# Patient Record
Sex: Male | Born: 1985 | Hispanic: Yes | Marital: Single | Smoking: Never smoker
Health system: Southern US, Community
[De-identification: ages and names within clinical notes are randomized; demographics above are authoritative.]

## PROBLEM LIST (undated history)

## (undated) DIAGNOSIS — T4145XA Adverse effect of unspecified anesthetic, initial encounter: Secondary | ICD-10-CM

## (undated) DIAGNOSIS — T8859XA Other complications of anesthesia, initial encounter: Secondary | ICD-10-CM

## (undated) DIAGNOSIS — Z789 Other specified health status: Secondary | ICD-10-CM

---
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
## 2018-10-04 ENCOUNTER — Encounter (HOSPITAL_COMMUNITY): Payer: Self-pay

## 2018-10-04 ENCOUNTER — Encounter (HOSPITAL_COMMUNITY)
Admission: EM | Disposition: A | Discharge status: Home / Self Care | Payer: Self-pay | Source: Home / Self Care | Attending: Emergency Medicine

## 2018-10-04 ENCOUNTER — Observation Stay (HOSPITAL_COMMUNITY): Payer: Worker's Compensation

## 2018-10-04 ENCOUNTER — Ambulatory Visit: Admit: 2018-10-04 | Payer: Self-pay | Admitting: Student

## 2018-10-04 ENCOUNTER — Emergency Department (HOSPITAL_COMMUNITY): Payer: Worker's Compensation | Admitting: Certified Registered Nurse Anesthetist

## 2018-10-04 ENCOUNTER — Emergency Department (HOSPITAL_COMMUNITY): Payer: Worker's Compensation

## 2018-10-04 ENCOUNTER — Other Ambulatory Visit: Payer: Self-pay

## 2018-10-04 ENCOUNTER — Observation Stay (HOSPITAL_COMMUNITY)
Admission: EM | Admit: 2018-10-04 | Discharge: 2018-10-06 | Disposition: A | Discharge status: Home / Self Care | Payer: Worker's Compensation | Attending: Student | Admitting: Student

## 2018-10-04 DIAGNOSIS — T148XXA Other injury of unspecified body region, initial encounter: Secondary | ICD-10-CM

## 2018-10-04 DIAGNOSIS — S82261A Displaced segmental fracture of shaft of right tibia, initial encounter for closed fracture: Principal | ICD-10-CM | POA: Insufficient documentation

## 2018-10-04 DIAGNOSIS — Y990 Civilian activity done for income or pay: Secondary | ICD-10-CM | POA: Insufficient documentation

## 2018-10-04 DIAGNOSIS — Z79899 Other long term (current) drug therapy: Secondary | ICD-10-CM | POA: Diagnosis not present

## 2018-10-04 DIAGNOSIS — Z7982 Long term (current) use of aspirin: Secondary | ICD-10-CM | POA: Insufficient documentation

## 2018-10-04 DIAGNOSIS — R2689 Other abnormalities of gait and mobility: Secondary | ICD-10-CM | POA: Insufficient documentation

## 2018-10-04 DIAGNOSIS — S82401A Unspecified fracture of shaft of right fibula, initial encounter for closed fracture: Secondary | ICD-10-CM

## 2018-10-04 DIAGNOSIS — S82201A Unspecified fracture of shaft of right tibia, initial encounter for closed fracture: Secondary | ICD-10-CM | POA: Diagnosis present

## 2018-10-04 DIAGNOSIS — Y929 Unspecified place or not applicable: Secondary | ICD-10-CM | POA: Insufficient documentation

## 2018-10-04 DIAGNOSIS — M79606 Pain in leg, unspecified: Secondary | ICD-10-CM | POA: Diagnosis present

## 2018-10-04 DIAGNOSIS — W230XXA Caught, crushed, jammed, or pinched between moving objects, initial encounter: Secondary | ICD-10-CM | POA: Insufficient documentation

## 2018-10-04 DIAGNOSIS — Z419 Encounter for procedure for purposes other than remedying health state, unspecified: Secondary | ICD-10-CM

## 2018-10-04 HISTORY — PX: TIBIA IM NAIL INSERTION: SHX2516

## 2018-10-04 LAB — CBC WITH DIFFERENTIAL/PLATELET
Abs Immature Granulocytes: 0.1 10*3/uL — ABNORMAL HIGH (ref 0.00–0.07)
BASOS ABS: 0.1 10*3/uL (ref 0.0–0.1)
BASOS PCT: 0 %
Eosinophils Absolute: 0.1 10*3/uL (ref 0.0–0.5)
Eosinophils Relative: 1 %
HCT: 46.5 % (ref 39.0–52.0)
Hemoglobin: 14.2 g/dL (ref 13.0–17.0)
Immature Granulocytes: 1 %
Lymphocytes Relative: 21 %
Lymphs Abs: 3.5 10*3/uL (ref 0.7–4.0)
MCH: 26.7 pg (ref 26.0–34.0)
MCHC: 30.5 g/dL (ref 30.0–36.0)
MCV: 87.4 fL (ref 80.0–100.0)
MONO ABS: 1.3 10*3/uL — AB (ref 0.1–1.0)
Monocytes Relative: 8 %
NRBC: 0 % (ref 0.0–0.2)
Neutro Abs: 11.3 10*3/uL — ABNORMAL HIGH (ref 1.7–7.7)
Neutrophils Relative %: 69 %
PLATELETS: 355 10*3/uL (ref 150–400)
RBC: 5.32 MIL/uL (ref 4.22–5.81)
RDW: 11.9 % (ref 11.5–15.5)
WBC: 16.3 10*3/uL — AB (ref 4.0–10.5)

## 2018-10-04 LAB — BASIC METABOLIC PANEL
Anion gap: 10 (ref 5–15)
BUN: 9 mg/dL (ref 6–20)
CHLORIDE: 109 mmol/L (ref 98–111)
CO2: 20 mmol/L — AB (ref 22–32)
CREATININE: 0.87 mg/dL (ref 0.61–1.24)
Calcium: 8.8 mg/dL — ABNORMAL LOW (ref 8.9–10.3)
GFR calc Af Amer: >60 mL/min (ref >60)
Glucose, Bld: 131 mg/dL — ABNORMAL HIGH (ref 70–99)
POTASSIUM: 3.6 mmol/L (ref 3.5–5.1)
Sodium: 139 mmol/L (ref 135–145)

## 2018-10-04 LAB — CBG MONITORING, ED: Glucose-Capillary: 110 mg/dL — ABNORMAL HIGH (ref 70–99)

## 2018-10-04 SURGERY — INSERTION, INTRAMEDULLARY ROD, TIBIA
Anesthesia: General | Site: Leg Lower | Laterality: Right

## 2018-10-04 MED ORDER — KETAMINE HCL 100 MG/ML IJ SOLN
INTRAMUSCULAR | Status: AC
  Filled 2018-10-04: qty 1

## 2018-10-04 MED ORDER — KETOROLAC TROMETHAMINE 15 MG/ML IJ SOLN
15.0000 mg | Freq: Four times a day (QID) | INTRAMUSCULAR | Status: AC
  Administered 2018-10-04 – 2018-10-05 (×5): 15 mg via INTRAVENOUS
  Filled 2018-10-04 (×5): qty 1

## 2018-10-04 MED ORDER — LACTATED RINGERS IV SOLN: INTRAVENOUS | Status: DC

## 2018-10-04 MED ORDER — ONDANSETRON HCL 4 MG/2ML IJ SOLN
4.0000 mg | Freq: Once | INTRAMUSCULAR | Status: AC
  Administered 2018-10-04: 4 mg via INTRAVENOUS
  Filled 2018-10-04: qty 2

## 2018-10-04 MED ORDER — SUGAMMADEX SODIUM 200 MG/2ML IV SOLN
INTRAVENOUS | Status: DC | PRN
  Administered 2018-10-04: 200 mg via INTRAVENOUS

## 2018-10-04 MED ORDER — LIDOCAINE 2% (20 MG/ML) 5 ML SYRINGE
INTRAMUSCULAR | Status: DC | PRN
  Administered 2018-10-04: 100 mg via INTRAVENOUS

## 2018-10-04 MED ORDER — MIDAZOLAM HCL 2 MG/2ML IJ SOLN
INTRAMUSCULAR | Status: DC | PRN
  Administered 2018-10-04: 2 mg via INTRAVENOUS

## 2018-10-04 MED ORDER — VANCOMYCIN HCL 1000 MG IV SOLR
INTRAVENOUS | Status: AC
  Filled 2018-10-04: qty 1000

## 2018-10-04 MED ORDER — FENTANYL CITRATE (PF) 100 MCG/2ML IJ SOLN
INTRAMUSCULAR | Status: AC
  Filled 2018-10-04: qty 2

## 2018-10-04 MED ORDER — FENTANYL CITRATE (PF) 250 MCG/5ML IJ SOLN
INTRAMUSCULAR | Status: AC
  Filled 2018-10-04: qty 5

## 2018-10-04 MED ORDER — HYDROMORPHONE HCL 1 MG/ML IJ SOLN
1.0000 mg | INTRAMUSCULAR | Status: DC | PRN
  Administered 2018-10-04 – 2018-10-06 (×2): 1 mg via INTRAVENOUS
  Filled 2018-10-04 (×2): qty 1

## 2018-10-04 MED ORDER — ONDANSETRON HCL 4 MG/2ML IJ SOLN
INTRAMUSCULAR | Status: AC
  Filled 2018-10-04: qty 2

## 2018-10-04 MED ORDER — ONDANSETRON HCL 4 MG PO TABS: 4.0000 mg | ORAL_TABLET | Freq: Four times a day (QID) | ORAL | Status: DC | PRN

## 2018-10-04 MED ORDER — DOCUSATE SODIUM 100 MG PO CAPS
100.0000 mg | ORAL_CAPSULE | Freq: Two times a day (BID) | ORAL | Status: DC
  Administered 2018-10-04 – 2018-10-06 (×4): 100 mg via ORAL
  Filled 2018-10-04 (×4): qty 1

## 2018-10-04 MED ORDER — KETAMINE HCL 10 MG/ML IJ SOLN
INTRAMUSCULAR | Status: DC | PRN
  Administered 2018-10-04: 50 mg via INTRAVENOUS

## 2018-10-04 MED ORDER — HYDROMORPHONE HCL 1 MG/ML IJ SOLN
1.0000 mg | Freq: Once | INTRAMUSCULAR | Status: AC
  Administered 2018-10-04: 1 mg via INTRAVENOUS
  Filled 2018-10-04: qty 1

## 2018-10-04 MED ORDER — METHOCARBAMOL 1000 MG/10ML IJ SOLN
500.0000 mg | Freq: Four times a day (QID) | INTRAVENOUS | Status: DC | PRN
  Filled 2018-10-04: qty 5

## 2018-10-04 MED ORDER — FENTANYL CITRATE (PF) 100 MCG/2ML IJ SOLN
25.0000 ug | INTRAMUSCULAR | Status: DC | PRN
  Administered 2018-10-04 (×3): 50 ug via INTRAVENOUS

## 2018-10-04 MED ORDER — DEXAMETHASONE SODIUM PHOSPHATE 10 MG/ML IJ SOLN
INTRAMUSCULAR | Status: AC
  Filled 2018-10-04: qty 1

## 2018-10-04 MED ORDER — 0.9 % SODIUM CHLORIDE (POUR BTL) OPTIME
TOPICAL | Status: DC | PRN
  Administered 2018-10-04: 1000 mL

## 2018-10-04 MED ORDER — DIPHENHYDRAMINE HCL 12.5 MG/5ML PO ELIX: 12.5000 mg | ORAL_SOLUTION | ORAL | Status: DC | PRN

## 2018-10-04 MED ORDER — METHOCARBAMOL 500 MG PO TABS
500.0000 mg | ORAL_TABLET | Freq: Four times a day (QID) | ORAL | Status: DC | PRN
  Administered 2018-10-05 – 2018-10-06 (×3): 500 mg via ORAL
  Filled 2018-10-04 (×3): qty 1

## 2018-10-04 MED ORDER — MEPERIDINE HCL 50 MG/ML IJ SOLN: 6.2500 mg | INTRAMUSCULAR | Status: DC | PRN

## 2018-10-04 MED ORDER — VANCOMYCIN HCL 1000 MG IV SOLR
INTRAVENOUS | Status: DC | PRN
  Administered 2018-10-04: 1000 mg via TOPICAL

## 2018-10-04 MED ORDER — CHLORHEXIDINE GLUCONATE 4 % EX LIQD: 60.0000 mL | Freq: Once | CUTANEOUS | Status: DC

## 2018-10-04 MED ORDER — SUCCINYLCHOLINE CHLORIDE 200 MG/10ML IV SOSY
PREFILLED_SYRINGE | INTRAVENOUS | Status: AC
  Filled 2018-10-04: qty 10

## 2018-10-04 MED ORDER — PROPOFOL 10 MG/ML IV BOLUS
INTRAVENOUS | Status: DC | PRN
  Administered 2018-10-04: 200 mg via INTRAVENOUS

## 2018-10-04 MED ORDER — METOCLOPRAMIDE HCL 5 MG/ML IJ SOLN: 10.0000 mg | Freq: Once | INTRAMUSCULAR | Status: DC | PRN

## 2018-10-04 MED ORDER — GABAPENTIN 100 MG PO CAPS
100.0000 mg | ORAL_CAPSULE | Freq: Three times a day (TID) | ORAL | Status: DC
  Administered 2018-10-04 – 2018-10-06 (×5): 100 mg via ORAL
  Filled 2018-10-04 (×5): qty 1

## 2018-10-04 MED ORDER — MIDAZOLAM HCL 2 MG/2ML IJ SOLN
INTRAMUSCULAR | Status: AC
  Filled 2018-10-04: qty 2

## 2018-10-04 MED ORDER — ONDANSETRON HCL 4 MG/2ML IJ SOLN: 4.0000 mg | Freq: Four times a day (QID) | INTRAMUSCULAR | Status: DC | PRN

## 2018-10-04 MED ORDER — SUCCINYLCHOLINE CHLORIDE 20 MG/ML IJ SOLN
INTRAMUSCULAR | Status: DC | PRN
  Administered 2018-10-04: 120 mg via INTRAVENOUS

## 2018-10-04 MED ORDER — POLYETHYLENE GLYCOL 3350 17 G PO PACK: 17.0000 g | PACK | Freq: Every day | ORAL | Status: DC | PRN

## 2018-10-04 MED ORDER — OXYCODONE-ACETAMINOPHEN 5-325 MG PO TABS: 1.0000 | ORAL_TABLET | ORAL | Status: DC | PRN

## 2018-10-04 MED ORDER — CEFAZOLIN SODIUM-DEXTROSE 2-4 GM/100ML-% IV SOLN
2.0000 g | Freq: Three times a day (TID) | INTRAVENOUS | Status: AC
  Administered 2018-10-04 – 2018-10-05 (×3): 2 g via INTRAVENOUS
  Filled 2018-10-04 (×3): qty 100

## 2018-10-04 MED ORDER — ONDANSETRON HCL 4 MG/2ML IJ SOLN
INTRAMUSCULAR | Status: DC | PRN
  Administered 2018-10-04: 4 mg via INTRAVENOUS

## 2018-10-04 MED ORDER — LACTATED RINGERS IV SOLN
INTRAVENOUS | Status: DC
  Administered 2018-10-04 (×2): via INTRAVENOUS

## 2018-10-04 MED ORDER — POVIDONE-IODINE 10 % EX SWAB: 2.0000 "application " | Freq: Once | CUTANEOUS | Status: DC

## 2018-10-04 MED ORDER — ASPIRIN 325 MG PO TABS
325.0000 mg | ORAL_TABLET | Freq: Every day | ORAL | Status: DC
  Administered 2018-10-04 – 2018-10-06 (×3): 325 mg via ORAL
  Filled 2018-10-04 (×3): qty 1

## 2018-10-04 MED ORDER — DEXAMETHASONE SODIUM PHOSPHATE 10 MG/ML IJ SOLN
INTRAMUSCULAR | Status: DC | PRN
  Administered 2018-10-04: 5 mg via INTRAVENOUS

## 2018-10-04 MED ORDER — BACITRACIN ZINC 500 UNIT/GM EX OINT
TOPICAL_OINTMENT | CUTANEOUS | Status: AC
  Filled 2018-10-04: qty 28.35

## 2018-10-04 MED ORDER — DEXMEDETOMIDINE HCL IN NACL 200 MCG/50ML IV SOLN
INTRAVENOUS | Status: DC | PRN
  Administered 2018-10-04: 8 ug via INTRAVENOUS
  Administered 2018-10-04 (×2): 20 ug via INTRAVENOUS

## 2018-10-04 MED ORDER — CEFAZOLIN SODIUM-DEXTROSE 2-4 GM/100ML-% IV SOLN
INTRAVENOUS | Status: AC
  Filled 2018-10-04: qty 100

## 2018-10-04 MED ORDER — KETAMINE HCL 50 MG/5ML IJ SOSY
PREFILLED_SYRINGE | INTRAMUSCULAR | Status: AC
  Filled 2018-10-04: qty 5

## 2018-10-04 MED ORDER — ROCURONIUM BROMIDE 10 MG/ML (PF) SYRINGE
PREFILLED_SYRINGE | INTRAVENOUS | Status: DC | PRN
  Administered 2018-10-04: 30 mg via INTRAVENOUS
  Administered 2018-10-04: 50 mg via INTRAVENOUS

## 2018-10-04 MED ORDER — CEFAZOLIN SODIUM-DEXTROSE 2-4 GM/100ML-% IV SOLN
2.0000 g | INTRAVENOUS | Status: AC
  Administered 2018-10-04: 2 g via INTRAVENOUS

## 2018-10-04 MED ORDER — OXYCODONE-ACETAMINOPHEN 5-325 MG PO TABS
2.0000 | ORAL_TABLET | Freq: Four times a day (QID) | ORAL | Status: DC | PRN
  Administered 2018-10-04 – 2018-10-06 (×5): 2 via ORAL
  Filled 2018-10-04 (×6): qty 2

## 2018-10-04 MED ORDER — SODIUM CHLORIDE 0.9 % IV SOLN
INTRAVENOUS | Status: DC | PRN
  Administered 2018-10-04: 250 mL via INTRAVENOUS

## 2018-10-04 MED ORDER — BACITRACIN 500 UNIT/GM EX OINT
TOPICAL_OINTMENT | CUTANEOUS | Status: DC | PRN
  Administered 2018-10-04: 1 via TOPICAL

## 2018-10-04 MED ORDER — FENTANYL CITRATE (PF) 250 MCG/5ML IJ SOLN
INTRAMUSCULAR | Status: DC | PRN
  Administered 2018-10-04: 25 ug via INTRAVENOUS
  Administered 2018-10-04: 100 ug via INTRAVENOUS
  Administered 2018-10-04: 25 ug via INTRAVENOUS
  Administered 2018-10-04: 100 ug via INTRAVENOUS
  Administered 2018-10-04: 50 ug via INTRAVENOUS
  Administered 2018-10-04 (×4): 25 ug via INTRAVENOUS
  Administered 2018-10-04: 150 ug via INTRAVENOUS

## 2018-10-04 MED ORDER — ACETAMINOPHEN 500 MG PO TABS
500.0000 mg | ORAL_TABLET | Freq: Two times a day (BID) | ORAL | Status: DC
  Administered 2018-10-04: 500 mg via ORAL
  Filled 2018-10-04 (×3): qty 1

## 2018-10-04 SURGICAL SUPPLY — 83 items
BANDAGE ACE 4X5 VEL STRL LF (GAUZE/BANDAGES/DRESSINGS) ×4 IMPLANT
BANDAGE ACE 6X5 VEL STRL LF (GAUZE/BANDAGES/DRESSINGS) ×4 IMPLANT
BANDAGE ELASTIC 4 VELCRO ST LF (GAUZE/BANDAGES/DRESSINGS) ×4 IMPLANT
BANDAGE ELASTIC 6 VELCRO ST LF (GAUZE/BANDAGES/DRESSINGS) ×4 IMPLANT
BIT DRILL CALIBRATED 4.2 (BIT) ×2 IMPLANT
BIT DRILL SHORT 4.2 (BIT) ×4 IMPLANT
BLADE SURG 10 STRL SS (BLADE) ×8 IMPLANT
BNDG COHESIVE 4X5 TAN STRL (GAUZE/BANDAGES/DRESSINGS) ×4 IMPLANT
BNDG GAUZE ELAST 4 BULKY (GAUZE/BANDAGES/DRESSINGS) ×8 IMPLANT
BRUSH SCRUB SURG 4.25 DISP (MISCELLANEOUS) ×8 IMPLANT
CANISTER WOUND CARE 500ML ATS (WOUND CARE) IMPLANT
CHLORAPREP W/TINT 26ML (MISCELLANEOUS) ×4 IMPLANT
CLOSURE WOUND 1/2 X4 (GAUZE/BANDAGES/DRESSINGS)
COVER SURGICAL LIGHT HANDLE (MISCELLANEOUS) ×8 IMPLANT
COVER WAND RF STERILE (DRAPES) ×4 IMPLANT
CUFF TOURNIQUET SINGLE 24IN (TOURNIQUET CUFF) IMPLANT
CUFF TOURNIQUET SINGLE 34IN LL (TOURNIQUET CUFF) IMPLANT
DRAPE C-ARM 42X72 X-RAY (DRAPES) ×4 IMPLANT
DRAPE C-ARMOR (DRAPES) ×4 IMPLANT
DRAPE HALF SHEET 40X57 (DRAPES) ×8 IMPLANT
DRAPE IMP U-DRAPE 54X76 (DRAPES) ×8 IMPLANT
DRAPE INCISE IOBAN 66X45 STRL (DRAPES) ×4 IMPLANT
DRAPE ORTHO SPLIT 77X108 STRL (DRAPES) ×4
DRAPE SURG ORHT 6 SPLT 77X108 (DRAPES) ×4 IMPLANT
DRAPE U-SHAPE 47X51 STRL (DRAPES) ×4 IMPLANT
DRILL BIT CALIBRATED 4.2 (BIT) ×4
DRILL BIT SHORT 4.2 (BIT) ×4
DRSG ADAPTIC 3X8 NADH LF (GAUZE/BANDAGES/DRESSINGS) ×4 IMPLANT
DRSG MEPITEL 4X7.2 (GAUZE/BANDAGES/DRESSINGS) IMPLANT
DRSG PAD ABDOMINAL 8X10 ST (GAUZE/BANDAGES/DRESSINGS) ×4 IMPLANT
DRSG VAC ATS LRG SENSATRAC (GAUZE/BANDAGES/DRESSINGS) IMPLANT
DRSG VAC ATS MED SENSATRAC (GAUZE/BANDAGES/DRESSINGS) IMPLANT
DRSG VAC ATS SM SENSATRAC (GAUZE/BANDAGES/DRESSINGS) IMPLANT
ELECT REM PT RETURN 9FT ADLT (ELECTROSURGICAL) ×4
ELECTRODE REM PT RTRN 9FT ADLT (ELECTROSURGICAL) ×2 IMPLANT
GAUZE SPONGE 4X4 12PLY STRL (GAUZE/BANDAGES/DRESSINGS) ×4 IMPLANT
GAUZE SPONGE 4X4 12PLY STRL LF (GAUZE/BANDAGES/DRESSINGS) ×4 IMPLANT
GLOVE BIO SURGEON STRL SZ7.5 (GLOVE) ×16 IMPLANT
GLOVE BIOGEL PI IND STRL 7.5 (GLOVE) ×2 IMPLANT
GLOVE BIOGEL PI INDICATOR 7.5 (GLOVE) ×2
GOWN STRL REUS W/ TWL LRG LVL3 (GOWN DISPOSABLE) ×4 IMPLANT
GOWN STRL REUS W/TWL LRG LVL3 (GOWN DISPOSABLE) ×4
GUIDEWIRE 3.2X400 (WIRE) ×4 IMPLANT
KIT BASIN OR (CUSTOM PROCEDURE TRAY) ×4 IMPLANT
KIT TURNOVER KIT B (KITS) ×4 IMPLANT
MANIFOLD NEPTUNE II (INSTRUMENTS) ×4 IMPLANT
NAIL TIBIAL EX W/BEND 10X345M (Nail) ×4 IMPLANT
NEEDLE 22X1 1/2 (OR ONLY) (NEEDLE) IMPLANT
NS IRRIG 1000ML POUR BTL (IV SOLUTION) ×4 IMPLANT
PACK GENERAL/GYN (CUSTOM PROCEDURE TRAY) ×4 IMPLANT
PACK ORTHO EXTREMITY (CUSTOM PROCEDURE TRAY) ×4 IMPLANT
PACK TOTAL JOINT (CUSTOM PROCEDURE TRAY) ×4 IMPLANT
PAD ARMBOARD 7.5X6 YLW CONV (MISCELLANEOUS) ×8 IMPLANT
PAD CAST 4YDX4 CTTN HI CHSV (CAST SUPPLIES) ×2 IMPLANT
PADDING CAST COTTON 4X4 STRL (CAST SUPPLIES) ×2
PADDING CAST COTTON 6X4 STRL (CAST SUPPLIES) ×4 IMPLANT
REAMER ROD DEEP FLUTE 2.5X950 (INSTRUMENTS) ×4 IMPLANT
SCREW LOCK STAR 5X34 (Screw) ×8 IMPLANT
SCREW LOCK STAR 5X70 (Screw) ×4 IMPLANT
SCREW LOCK STAR 5X72 (Screw) ×4 IMPLANT
SCREW LOCKING 5.0X34MM (Screw) ×4 IMPLANT
SET MONITOR QUICK PRESSURE (MISCELLANEOUS) IMPLANT
SPLINT PLASTER CAST XFAST 5X30 (CAST SUPPLIES) ×2 IMPLANT
SPLINT PLASTER XFAST SET 5X30 (CAST SUPPLIES) ×2
SPONGE LAP 18X18 X RAY DECT (DISPOSABLE) ×4 IMPLANT
STAPLER VISISTAT 35W (STAPLE) ×4 IMPLANT
STOCKINETTE IMPERVIOUS 9X36 MD (GAUZE/BANDAGES/DRESSINGS) ×4 IMPLANT
STRIP CLOSURE SKIN 1/2X4 (GAUZE/BANDAGES/DRESSINGS) IMPLANT
SUT ETHILON 2 0 FSLX (SUTURE) IMPLANT
SUT MNCRL AB 3-0 PS2 18 (SUTURE) ×4 IMPLANT
SUT PROLENE 0 CT (SUTURE) IMPLANT
SUT VIC AB 0 CT1 27 (SUTURE)
SUT VIC AB 0 CT1 27XBRD ANBCTR (SUTURE) IMPLANT
SUT VIC AB 0 CTB1 27 (SUTURE) IMPLANT
SUT VIC AB 2-0 CT1 27 (SUTURE)
SUT VIC AB 2-0 CT1 TAPERPNT 27 (SUTURE) IMPLANT
SUT VIC AB 2-0 CT3 27 (SUTURE) IMPLANT
SUT VIC AB 2-0 CTB1 (SUTURE) IMPLANT
TOWEL OR 17X24 6PK STRL BLUE (TOWEL DISPOSABLE) ×8 IMPLANT
TOWEL OR 17X26 10 PK STRL BLUE (TOWEL DISPOSABLE) ×8 IMPLANT
UNDERPAD 30X30 (UNDERPADS AND DIAPERS) ×4 IMPLANT
WATER STERILE IRR 1000ML POUR (IV SOLUTION) ×4 IMPLANT
YANKAUER SUCT BULB TIP NO VENT (SUCTIONS) IMPLANT

## 2018-10-04 NOTE — Anesthesia Procedure Notes (Signed)
Procedure Name: Intubation Date/Time: 10/04/2018 12:49 PM Performed by: White, Amedeo Plenty, CRNA Pre-anesthesia Checklist: Patient identified, Emergency Drugs available, Suction available and Patient being monitored Patient Re-evaluated:Patient Re-evaluated prior to induction Oxygen Delivery Method: Circle System Utilized Preoxygenation: Pre-oxygenation with 100% oxygen Induction Type: IV induction, Rapid sequence and Cricoid Pressure applied Laryngoscope Size: Mac and 4 Grade View: Grade I Tube type: Oral Tube size: 7.5 mm Number of attempts: 1 Airway Equipment and Method: Stylet and Oral airway Placement Confirmation: ETT inserted through vocal cords under direct vision,  positive ETCO2 and breath sounds checked- equal and bilateral Secured at: 21 cm Tube secured with: Tape Dental Injury: Teeth and Oropharynx as per pre-operative assessment

## 2018-10-04 NOTE — ED Provider Notes (Signed)
[facility] EMERGENCY DEPARTMENT Provider Note   CSN: [id number] Arrival date & time: 10/04/18  1004     History   Chief Complaint Chief Complaint  Patient presents with  . Leg Injury    HPI Noach Calvillo is a 32 y.o. male.  The history is provided by the patient and the EMS personnel. No language interpreter was used.   Devontre Siedschlag is a 32 y.o. male who presents to the Emergency Department complaining of leg injury. He presents to the emergency department by EMS for evaluation of right leg injury. 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 There was a chain attached to a metal bar. The metal became disengaged from the chain and swung to hit him in the right to bed. He experienced immediate pain and it knocked him down. He did not knock off the platform. He denies any head injury or loss of consciousness. He received 250 g of fentanyl by EMS prior to ED arrival. He has no medical problems and takes no medications. History reviewed. No pertinent past medical history.  There are no active problems to display for this patient.   History reviewed. No pertinent surgical history.      Home Medications    Prior to Admission medications   Not on File    Family History No family history on file.  Social History Social History   Tobacco Use  . Smoking status: Never Smoker  . Smokeless tobacco: Never Used  Substance Use Topics  . Alcohol use: Never    Frequency: Never  . Drug use: Never     Allergies   Patient has no allergy information on record.   Review of Systems Review of Systems  All other systems reviewed and are negative.    Physical Exam Updated Vital Signs Ht 5\' 7"  (1.702 m)   Wt 104.3 kg   BMI 36.02 kg/m   Physical Exam  Constitutional: He is oriented to person, place, and time. He appears well-developed and well-nourished.  HENT:  Head: Normocephalic and atraumatic.  Cardiovascular:  Normal rate and regular rhythm.  No murmur heard. Pulmonary/Chest: Effort normal and breath sounds normal. No respiratory distress.  Abdominal: Soft. There is no tenderness. There is no rebound and no guarding.  Musculoskeletal:  2+ DP pulses bilaterally. There is a deformity with local tenderness and swelling to the right mid chin. There is an abrasion over the volar surface of the left fifth digit with flexion extension intact.  Neurological: He is alert and oriented to person, place, and time.  Skin: Skin is warm. He is diaphoretic.  Psychiatric: He has a normal mood and affect. His behavior is normal.  Nursing note and vitals reviewed.    ED Treatments / Results  Labs (all labs ordered are listed, but only abnormal results are displayed) Labs Reviewed  CBG MONITORING, ED    EKG None  Radiology No results found.  Procedures Procedures (including critical care time)  Medications Ordered in ED Medications - No data to display   Initial Impression / Assessment and Plan / ED Course  I have reviewed the triage vital signs and the nursing notes.  Pertinent labs & imaging results that were available during my care of the patient were reviewed by me and considered in my medical decision making (see chart for details).     Patient here for evaluation of right leg injury. He has a right shin deformity with good  distal pulses. No evidence of additional significant trauma. Discussed with orthopedics on-call who evaluated the patient in the emergency department. Plan to admit for further surgical treatment.  Final Clinical Impressions(s) / ED Diagnoses   Final diagnoses:  Leg pain    ED Discharge Orders    None       Tilden Fossa, MD 10/04/18 1624

## 2018-10-04 NOTE — Transfer of Care (Signed)
Immediate Anesthesia Transfer of Care Note  Patient: Christopher Garrett  Procedure(s) Performed: INTRAMEDULLARY (IM) NAIL TIBIAL (Right Leg Lower)  Patient Location: PACU  Anesthesia Type:General  Level of Consciousness: drowsy and responds to stimulation  Airway & Oxygen Therapy: Patient Spontanous Breathing and Patient connected to nasal cannula oxygen  Post-op Assessment: Report given to RN and Post -op Vital signs reviewed and stable  Post vital signs: Reviewed and stable  Last Vitals:  Vitals Value Taken Time  BP 140/92 10/04/2018  4:21 PM  Temp    Pulse 114 10/04/2018  4:28 PM  Resp 14 10/04/2018  4:28 PM  SpO2 97 % 10/04/2018  4:28 PM  Vitals shown include unvalidated device data.  Last Pain:  Vitals:   10/04/18 1609  TempSrc:   PainSc: 7       Patients Stated Pain Goal: 4 (10/04/18 1609)  Complications: No apparent anesthesia complications

## 2018-10-04 NOTE — Op Note (Signed)
OrthopaedicSurgeryOperativeNote (ZOX:[id number]) Date of Surgery: 10/04/2018  Admit Date: 10/04/2018   Diagnoses: Pre-Op Diagnoses: Right segmental tibia fracture  Post-Op Diagnosis: Same  Procedures: CPT 27759-Intramedullary nailing of right tibial shaft fracture  Surgeons: Primary: Roby Lofts, MD   Location:[facility] OR ROOM 06   Anesthesia: General  Antibiotics:Ancef 2g preop   Tourniquettime:None  EstimatedBloodLoss:100 mL  Complications:None  Specimens:None  Implants: Implant Name Type Inv. Item Serial No. Manufacturer Lot No. LRB No. Used Action  NAIL TIBIAL EX W/BEND 10X345M - [id number] Nail NAIL TIBIAL EX W/BEND 10X345M  [employer] [id number] Right 1 Implanted  SCREW LOCKING 5.0X34MM - [id number] Screw SCREW LOCKING 5.0X34MM  [employer] [id number] Right 1 Implanted  SCREW LOCK STAR 5X34 - [id number] Screw SCREW LOCK STAR 5X34  [employer]  Right 1 Implanted  SCREW LOCK STAR 5X34 - [id number] Screw SCREW LOCK STAR 5X34  [employer]  Right 1 Implanted  SCREW LOCK STAR 5X72 - [id number] Screw SCREW LOCK STAR 5X72  [employer]  Right 1 Implanted  SCREW LOCK STAR 5X70 - [id number] Screw SCREW LOCK STAR 5X70  [employer]UMA  Right 1 Implanted    IndicationsforSurgery: 32 year old male who had a 600 pound beam fall on him and struck him in the right leg.  He immediate pain and deformity.  X-rays showed a segmental right tibial shaft fracture.  He had significant swelling in his compartments however they were soft and he has no pain with passive stretch.  However he has at high risk of developing compartment syndrome so I recommend proceeding urgently to perform intramedullary nailing with possible fasciotomy release depending on compartments.  Risks and benefits were discussed with the patient.Risks discussed included bleeding requiring blood transfusion, bleeding causing a hematoma, infection, malunion, nonunion, damage to surrounding nerves and blood  vessels, pain, hardware prominence or irritation, hardware failure, stiffness, post-traumatic arthritis, DVT/PE, compartment syndrome, and even death. Patient agrees to proceed with surgery and consent was obtained.  Operative Findings: Intramedullary nailing of right segmental tibial shaft fracture using Synthes EX nail 10x365mm with 3 proximal and two distal interlocking screws  Procedure: The patient was identified in the preoperative holding area. Consent was confirmed with the patient and all questions were answered. The operative extremity was marked after confirmation with the patient. They were then brought back to the operating room by our anesthesia colleagues.  The patient was placed under general anesthetic and then carefully transferred over to a radiolucent flat top table.  Here a bump was placed under the operative hip and a bone foam was placed under the operative extremity.  The operative extremity was then prepped and draped in usual sterile fashion. A preoperative timeout was performed to verify the patient, the procedure, and the extremity. Preoperative antibiotics were dosed.  Fluoroscopic imaging was obtained to show the displacement of the fracture.   I made a lateral parapatellar incision carried down through skin and subcutaneous tissue just lateral to the patellar tendon.  I released a portion of the lateral retinaculum but stayed extra-articular outside the capsule.  I excised the anterior fat pad and then proceeded to place a guidepin under fluoroscopic imaging to confirm adequate placement in both the AP and lateral views.  I then advanced a wire into the proximal metaphysis of the tibia.  I then used an entry reamer to enter the canal.  I passed a bent ball-tipped guidewire down the center of the canal and was able to pass in the distal segment after performing a closed reduction  maneuver.  I seated it down into the physeal scar.  I then measured the length of the nail and I  chose a 315 mm nail.  To prevent valgus and procurvatum deformity I placed two percutaneous incisions to place a pointed reduction tenaculum to hold the proximal reduction. I then proceeded to sequentially ream up from 8.5 mm to 11 mm.  I obtained excellent chatter and I chose to place an 10 mm nail.  I then placed nail across the fracture into the distal segment.  The fracture had excellent reduction after the nail was placed.  I seated the nail to where it was slightly buried at the lateral of the knee.  I then placed 2 distal interlocking screws from medial to lateral using perfect circle technique.  I then used my proximal jig to place 3 proximal interlocking screws through percutaneous incisions. I removed my clamp and the reduction was maintained. I removed the jig and obtained final fluoroscopic imaging.  The compartments were swollne but compressible and with preoperative exam, I did not feel the need to perform fasciotomy releases. The incisions were copiously irrigated.  I closed the lateral parapatellar incision with 0 Vicryl, 2-0 Vicryl and 3-0 nylon.  The remainder the incisions were closed with 3-0 nylon. A sterile dressing was placed and a well padded short leg splint was placed.. The patient was then awoken from anesthesia and taken to the PACU in stable condition.  Post Op Plan/Instructions: The patient will be touchdown weightbearing to the right lower extremity. Ancef postoperative prophylaxis. Aspirin 325 mg for DVT prophylaxis.  I was present and performed the entire surgery.  Truitt Merle, MD [facility]

## 2018-10-04 NOTE — Consult Note (Signed)
Reason for Consult:Right tib/fib fx Referring Physician: [name] is an 32 y.o. male.  HPI: Christopher Garrett was working when a 600# metal beam on a chain swung down and struck him in the right lower leg. He had immediate pain and could not get up and bear weight. He came to the ED for evaluation and x-rays showed a tib/fib fx and orthopedic surgery was consulted.  History reviewed. No pertinent past medical history.  History reviewed. No pertinent surgical history.  No family history on file.  Social History:  reports that he has never smoked. He has never used smokeless tobacco. He reports that he does not drink alcohol or use drugs.  Allergies: No Known Allergies  Medications: I have reviewed the patient's current medications.  Results for orders placed or performed during the hospital encounter of 10/04/18 (from the past 48 hour(s))  CBG monitoring, ED     Status: Abnormal   Collection Time: 10/04/18 10:11 AM  Result Value Ref Range   Glucose-Capillary 110 (H) 70 - 99 mg/dL  CBC with Differential     Status: Abnormal   Collection Time: 10/04/18 10:17 AM  Result Value Ref Range   WBC 16.3 (H) 4.0 - 10.5 K/uL   RBC 5.32 4.22 - 5.81 MIL/uL   Hemoglobin 14.2 13.0 - 17.0 g/dL   HCT 16.1 09.6 - 04.5 %   MCV 87.4 80.0 - 100.0 fL   MCH 26.7 26.0 - 34.0 pg   MCHC 30.5 30.0 - 36.0 g/dL   RDW 40.9 81.1 - 91.4 %   Platelets 355 150 - 400 K/uL   nRBC 0.0 0.0 - 0.2 %   Neutrophils Relative % 69 %   Neutro Abs 11.3 (H) 1.7 - 7.7 K/uL   Lymphocytes Relative 21 %   Lymphs Abs 3.5 0.7 - 4.0 K/uL   Monocytes Relative 8 %   Monocytes Absolute 1.3 (H) 0.1 - 1.0 K/uL   Eosinophils Relative 1 %   Eosinophils Absolute 0.1 0.0 - 0.5 K/uL   Basophils Relative 0 %   Basophils Absolute 0.1 0.0 - 0.1 K/uL   Immature Granulocytes 1 %   Abs Immature Granulocytes 0.10 (H) 0.00 - 0.07 K/uL    Comment: Performed at [facility], [address] [place], [place] [address]    No  results found.  Review of Systems  Constitutional: Negative for weight loss.  HENT: Negative for ear discharge, ear pain, hearing loss and tinnitus.   Eyes: Negative for blurred vision, double vision, photophobia and pain.  Respiratory: Negative for cough, sputum production and shortness of breath.   Cardiovascular: Negative for chest pain.  Gastrointestinal: Negative for abdominal pain, nausea and vomiting.  Genitourinary: Negative for dysuria, flank pain, frequency and urgency.  Musculoskeletal: Positive for joint pain (Right lower leg, left little finger). Negative for back pain, falls, myalgias and neck pain.  Neurological: Negative for dizziness, tingling, sensory change, focal weakness, loss of consciousness and headaches.  Endo/Heme/Allergies: Does not bruise/bleed easily.  Psychiatric/Behavioral: Negative for depression, memory loss and substance abuse. The patient is not nervous/anxious.    Blood pressure (!) 140/96, pulse 80, temperature 98.7 F (37.1 C), temperature source Oral, resp. rate 16, height 5\' 7"  (1.702 m), weight 104.3 kg, SpO2 97 %. Physical Exam  Constitutional: He appears well-developed and well-nourished. No distress.  HENT:  Head: Normocephalic and atraumatic.  Eyes: Conjunctivae are normal. Right eye exhibits no discharge. Left eye exhibits no discharge. No scleral icterus.  Neck: Normal range of motion.  Cardiovascular: Normal rate and regular rhythm.  Respiratory: Effort normal. No respiratory distress.  Musculoskeletal:  RLE No traumatic wounds, ecchymosis, or rash  Lower leg TTP, compartments soft  No knee or ankle effusion  Sens DPN, SPN, TN intact  Motor EHL, ext, flex, evers 5/5  DP 2+, PT 1+, No significant edema  Neurological: He is alert.  Skin: Skin is warm and dry. He is not diaphoretic.  Psychiatric: He has a normal mood and affect. His behavior is normal.    Assessment/Plan: Right tib/fib fx -- Plan for IMN vs ex fix +/- fasciotomies by  Dr. Jena Gauss today. Please keep NPO.    Christopher Caldron, PA-C Orthopedic Surgery [phone] 10/04/2018, 10:52 AM

## 2018-10-04 NOTE — Anesthesia Preprocedure Evaluation (Signed)
Anesthesia Evaluation  Patient identified by MRN, date of birth, ID band Patient awake    Reviewed: Allergy & Precautions, NPO status , Patient's Chart, lab work & pertinent test results  Airway Mallampati: II  TM Distance: >3 FB Neck ROM: Full    Dental no notable dental hx.    Pulmonary neg pulmonary ROS,    Pulmonary exam normal breath sounds clear to auscultation       Cardiovascular negative cardio ROS Normal cardiovascular exam Rhythm:Regular Rate:Normal     Neuro/Psych negative neurological ROS  negative psych ROS   GI/Hepatic negative GI ROS, Neg liver ROS,   Endo/Other  negative endocrine ROS  Renal/GU negative Renal ROS  negative genitourinary   Musculoskeletal negative musculoskeletal ROS (+)   Abdominal   Peds negative pediatric ROS (+)  Hematology negative hematology ROS (+)   Anesthesia Other Findings   Reproductive/Obstetrics negative OB ROS                             Anesthesia Physical Anesthesia Plan  ASA: II  Anesthesia Plan: General   Post-op Pain Management:    Induction: Intravenous, Rapid sequence and Cricoid pressure planned  PONV Risk Score and Plan: 2 and Ondansetron and Treatment may vary due to age or medical condition  Airway Management Planned: Oral ETT  Additional Equipment:   Intra-op Plan:   Post-operative Plan: Extubation in OR  Informed Consent: I have reviewed the patients History and Physical, chart, labs and discussed the procedure including the risks, benefits and alternatives for the proposed anesthesia with the patient or authorized representative who has indicated his/her understanding and acceptance.   Dental advisory given  Plan Discussed with: CRNA  Anesthesia Plan Comments:         Anesthesia Quick Evaluation

## 2018-10-04 NOTE — ED Triage Notes (Signed)
Pt brought in by EMS due to having a right leg pain. Pt was at work and a piece of metal fell on right leg pain. Pt has deformity to right leg pain. +2 pedal pulse in right foot. Pt denies LOC, hitting his head, or n/v.

## 2018-10-05 ENCOUNTER — Encounter (HOSPITAL_COMMUNITY): Payer: Self-pay | Admitting: Student

## 2018-10-05 LAB — CBC
HEMATOCRIT: 38.2 % — AB (ref 39.0–52.0)
Hemoglobin: 11.4 g/dL — ABNORMAL LOW (ref 13.0–17.0)
MCH: 26.5 pg (ref 26.0–34.0)
MCHC: 29.8 g/dL — AB (ref 30.0–36.0)
MCV: 88.8 fL (ref 80.0–100.0)
NRBC: 0 % (ref 0.0–0.2)
PLATELETS: 284 10*3/uL (ref 150–400)
RBC: 4.3 MIL/uL (ref 4.22–5.81)
RDW: 12.1 % (ref 11.5–15.5)
WBC: 11.7 10*3/uL — ABNORMAL HIGH (ref 4.0–10.5)

## 2018-10-05 LAB — HIV ANTIBODY (ROUTINE TESTING W REFLEX): HIV SCREEN 4TH GENERATION: NONREACTIVE

## 2018-10-05 MED ORDER — METHOCARBAMOL 500 MG PO TABS: 500.0000 mg | ORAL_TABLET | Freq: Four times a day (QID) | ORAL | 1 refills | Status: DC | PRN

## 2018-10-05 MED ORDER — OXYCODONE-ACETAMINOPHEN 5-325 MG PO TABS: 1.0000 | ORAL_TABLET | ORAL | 0 refills | Status: DC | PRN

## 2018-10-05 MED ORDER — ASPIRIN 325 MG PO TABS: 325.0000 mg | ORAL_TABLET | Freq: Every day | ORAL | 0 refills | Status: DC

## 2018-10-05 NOTE — Plan of Care (Signed)
  Problem: Pain Managment: Goal: General experience of comfort will improve Outcome: Progressing   Problem: Safety: Goal: Ability to remain free from injury will improve Outcome: Progressing   

## 2018-10-05 NOTE — Care Management Note (Addendum)
Case Management Note  Patient Details  Name: Christopher Garrett MRN: [id number] Date of Birth: 1986/05/06  Subjective/Objective:    32 yr old gentleman struck at work by a 600 lb metal beam, fracturing his right leg. Patient underwent IM Nailing of right Tibia/Fibula.                Action/Plan: Case manager spoke with patient's worker's comp Claims agent Fontaine No with [employer]- [phone] ext [phone], concerning patient's discharge plan and DME needs. Should patient discharge today Clydie Braun authorized DME be provided by [facility]. Orders will be faxed to her at [phone].   CM contacted Shon Millet, [facility] Liaison with request for DME. Patient will not need Home Health services at discharge.    Expected Discharge Date:   10/05/18               Expected Discharge Plan:  Home/Self Care  In-House Referral:  NA  Discharge planning Services  CM Consult  Post Acute Care Choice:  Durable Medical Equipment Choice offered to:  NA  DME Arranged:  3-N-1, Walker rolling DME Agency:  [facility]  HH Arranged:  NA HH Agency:  NA  Status of Service:  Completed, signed off  If discussed at Long Length of Stay Meetings, dates discussed:    Additional Comments: Case manager received information for patient to go to pharmacy from Fontaine No, CM; ION#[id number], Control# [id number], Group [id number], DOI 10/04/18. This information was provided to patient.   Durenda Guthrie, RN 10/05/2018, 11:57 AM

## 2018-10-05 NOTE — Plan of Care (Signed)

## 2018-10-05 NOTE — Progress Notes (Signed)
Orthopaedic Trauma Progress Note  S: Doing well, pain controlled. No major issues overnight  O:  Vitals:   10/04/18 2137 10/05/18 0550  BP: (!) 150/86 113/74  Pulse: (!) 110 90  Resp: 20 17  Temp: 98.3 F (36.8 C) 98.3 F (36.8 C)  SpO2: 100% 100%   Gen: NAD, AAOx3 RLE: Dressing is with some soak through the splint. Compartments swollen but compressible. Wiggles toes, sensation intact. No pain with passive stretch.  Imaging: Stable postoperative imaging  Labs:  Results for orders placed or performed during the hospital encounter of 10/04/18 (from the past 24 hour(s))  CBG monitoring, ED     Status: Abnormal   Collection Time: 10/04/18 10:11 AM  Result Value Ref Range   Glucose-Capillary 110 (H) 70 - 99 mg/dL  Basic metabolic panel     Status: Abnormal   Collection Time: 10/04/18 10:17 AM  Result Value Ref Range   Sodium 139 135 - 145 mmol/L   Potassium 3.6 3.5 - 5.1 mmol/L   Chloride 109 98 - 111 mmol/L   CO2 20 (L) 22 - 32 mmol/L   Glucose, Bld 131 (H) 70 - 99 mg/dL   BUN 9 6 - 20 mg/dL   Creatinine, Ser 6.04 0.61 - 1.24 mg/dL   Calcium 8.8 (L) 8.9 - 10.3 mg/dL   GFR calc non Af Amer >60 >60 mL/min   GFR calc Af Amer >60 >60 mL/min   Anion gap 10 5 - 15  CBC with Differential     Status: Abnormal   Collection Time: 10/04/18 10:17 AM  Result Value Ref Range   WBC 16.3 (H) 4.0 - 10.5 K/uL   RBC 5.32 4.22 - 5.81 MIL/uL   Hemoglobin 14.2 13.0 - 17.0 g/dL   HCT 54.0 98.1 - 19.1 %   MCV 87.4 80.0 - 100.0 fL   MCH 26.7 26.0 - 34.0 pg   MCHC 30.5 30.0 - 36.0 g/dL   RDW 47.8 29.5 - 62.1 %   Platelets 355 150 - 400 K/uL   nRBC 0.0 0.0 - 0.2 %   Neutrophils Relative % 69 %   Neutro Abs 11.3 (H) 1.7 - 7.7 K/uL   Lymphocytes Relative 21 %   Lymphs Abs 3.5 0.7 - 4.0 K/uL   Monocytes Relative 8 %   Monocytes Absolute 1.3 (H) 0.1 - 1.0 K/uL   Eosinophils Relative 1 %   Eosinophils Absolute 0.1 0.0 - 0.5 K/uL   Basophils Relative 0 %   Basophils Absolute 0.1 0.0 - 0.1  K/uL   Immature Granulocytes 1 %   Abs Immature Granulocytes 0.10 (H) 0.00 - 0.07 K/uL  CBC     Status: Abnormal   Collection Time: 10/05/18  5:33 AM  Result Value Ref Range   WBC 11.7 (H) 4.0 - 10.5 K/uL   RBC 4.30 4.22 - 5.81 MIL/uL   Hemoglobin 11.4 (L) 13.0 - 17.0 g/dL   HCT 30.8 (L) 65.7 - 84.6 %   MCV 88.8 80.0 - 100.0 fL   MCH 26.5 26.0 - 34.0 pg   MCHC 29.8 (L) 30.0 - 36.0 g/dL   RDW 96.2 95.2 - 84.1 %   Platelets 284 150 - 400 K/uL   nRBC 0.0 0.0 - 0.2 %    Assessment: 32 year old male hit by 600 pound beam  Injuries: Right closed segmental tibial shaft fracture status post intramedullary nailing  Weightbearing: Nonweightbearing right lower extremity  Insicional and dressing care: Splint and dressing to remain clean dry and intact  Orthopedic device(s): None needed  CV/Blood loss: Hemoglobin 11.4 this morning.  Hemodynamically stable.  No need for transfusion or recheck  Pain management: 1. Percocet 1-2 tabs every 4-6 hours PRN 2.  Dilaudid 1 mg every 3 hours PRN 3.  Gabapentin 100 mg 3 times daily 4.  Toradol 15 mg every 6 hours 5.  Tylenol 500 mg every 12 hours  VTE prophylaxis: Aspirin 325 mg daily with SCDs  ID: Ancef 2 g every 8 hours x24 hours postoperatively  Foley/Lines: None, KVO IVF  Medical co-morbidities: None  Dispo: PT/OT eval possible D/C today vs tomorrow  Follow - up plan: 2 weeks   Roby Lofts, MD [facility] [phone] (phone)

## 2018-10-05 NOTE — Evaluation (Addendum)
Physical Therapy Evaluation Patient Details Name: Christopher Garrett MRN: [id number] DOB: 12-Feb-1986 Today's Date: 10/05/2018   History of Present Illness  32 yo admitted after hit by piece of metal at work with right tibia fx s/p IM nail. No significant PMHx  Clinical Impression  Pt pleasant and eager to return home. Pt moving well and in chair on arrival after OT session. Pt able to walk hall distance with Rw and prefers RW over crutches. Educated for stair ambulation with pt able to perform. Pt with decreased activity tolerance, strength and ROM who will benefit from acute therapy to maximize mobility and gait but pt moving well enough for return home with DME. Pt educated for use of belt or sheet as leg lifter to assist with positioning and transfers. Pt also encouraged to have tennis shoe for left foot to decrease impact with hopping.    Follow Up Recommendations No PT follow up    Equipment Recommendations  Rolling walker with 5" wheels    Recommendations for Other Services       Precautions / Restrictions Restrictions Weight Bearing Restrictions: Yes RLE Weight Bearing: Non weight bearing      Mobility  Bed Mobility               General bed mobility comments: in chair on arrival  Transfers Overall transfer level: Needs assistance   Transfers: Sit to/from Stand Sit to Stand: Supervision         General transfer comment: cues for hand placement and safety  Ambulation/Gait Ambulation/Gait assistance: Supervision Gait Distance (Feet): 300 Feet Assistive device: Rolling walker (2 wheeled) Gait Pattern/deviations: Step-to pattern   Gait velocity interpretation: >2.62 ft/sec, indicative of community ambulatory General Gait Details: cues for step length, position in RW with pt able to maintain with RLE NWB after initial cues  Stairs Stairs: Yes Stairs assistance: Min assist Stair Management: Backwards;With walker Number of Stairs: 2 General stair  comments: cues for sequence with min assist to support and control RW, handout provided  Wheelchair Mobility    Modified Rankin (Stroke Patients Only)       Balance                                             Pertinent Vitals/Pain Pain Assessment: 0-10 Pain Score: 3  Pain Location: RLE Pain Descriptors / Indicators: Sore Pain Intervention(s): Limited activity within patient's tolerance;Repositioned;Monitored during session;Premedicated before session    Home Living Family/patient expects to be discharged to:: Private residence Living Arrangements: Spouse/significant other;Children Available Help at Discharge: Family;Available 24 hours/day Type of Home: House Home Access: Stairs to enter Entrance Stairs-Rails: None Entrance Stairs-Number of Steps: 2 Home Layout: One level Home Equipment: None      Prior Function Level of Independence: Independent               Hand Dominance        Extremity/Trunk Assessment   Upper Extremity Assessment Upper Extremity Assessment: Overall WFL for tasks assessed    Lower Extremity Assessment Lower Extremity Assessment: RLE deficits/detail RLE Deficits / Details: limited strength and ROM due to pain, needs assist to lift leg from chair    Cervical / Trunk Assessment Cervical / Trunk Assessment: Normal  Communication   Communication: Prefers language other than English(spanish 1st language, ESL)  Cognition Arousal/Alertness: Awake/alert Behavior During Therapy: WFL for tasks assessed/performed Overall  Cognitive Status: Within Functional Limits for tasks assessed                                        General Comments      Exercises     Assessment/Plan    PT Assessment Patient needs continued PT services  PT Problem List Decreased mobility;Decreased activity tolerance;Decreased strength;Decreased range of motion       PT Treatment Interventions DME instruction;Functional  mobility training;Patient/family education;Gait training;Stair training;Therapeutic exercise    PT Goals (Current goals can be found in the Care Plan section)  Acute Rehab PT Goals Patient Stated Goal: return home PT Goal Formulation: With patient Time For Goal Achievement: 10/12/18 Potential to Achieve Goals: Good    Frequency Min 4X/week   Barriers to discharge        Co-evaluation               AM-PAC PT "6 Clicks" Daily Activity  Outcome Measure Difficulty turning over in bed (including adjusting bedclothes, sheets and blankets)?: A Little Difficulty moving from lying on back to sitting on the side of the bed? : A Little Difficulty sitting down on and standing up from a chair with arms (e.g., wheelchair, bedside commode, etc,.)?: A Little Help needed moving to and from a bed to chair (including a wheelchair)?: A Little Help needed walking in hospital room?: A Little Help needed climbing 3-5 steps with a railing? : A Little 6 Click Score: 18    End of Session Equipment Utilized During Treatment: Gait belt Activity Tolerance: Patient tolerated treatment well Patient left: in chair;with call bell/phone within reach;with family/visitor present Nurse Communication: Mobility status PT Visit Diagnosis: Other abnormalities of gait and mobility (R26.89)    Time: 1610-9604 PT Time Calculation (min) (ACUTE ONLY): 19 min   Charges:   PT Evaluation $PT Eval Low Complexity: 1 Low          Savonna Birchmeier Abner Greenspan, PT Acute Rehabilitation Services Pager: [phone] Office: [phone]   Kischa Altice B Aleshia Cartelli 10/05/2018, 11:44 AM

## 2018-10-05 NOTE — Discharge Instructions (Addendum)
Orthopaedic Trauma Service Discharge Instructions   General Discharge Instructions  WEIGHT BEARING STATUS:Nonweightbearing to right leg until follow up  RANGE OF MOTION/ACTIVITY:No restrictions otherwise. May move knee as much as possible. Come out of boot to move ankle  Wound Care:Remove dressings on Friday 10/25. Once the incisions are dry you may shower and leave open to air. Cover the incisions around your ankle to prevent the boot from irritating the incisions.  DVT/PE prophylaxis:Take a daily aspirin to prevent blood clots  Diet: as you were eating previously.  Can use over the counter stool softeners and bowel preparations, such as Miralax, to help with bowel movements.  Narcotics can be constipating.  Be sure to drink plenty of fluids  PAIN MEDICATION USE AND EXPECTATIONS  You have likely been given narcotic medications to help control your pain.  After a traumatic event that results in an fracture (broken bone) with or without surgery, it is ok to use narcotic pain medications to help control one's pain.  We understand that everyone responds to pain differently and each individual patient will be evaluated on a regular basis for the continued need for narcotic medications. Ideally, narcotic medication use should last no more than 6-8 weeks (coinciding with fracture healing).   As a patient it is your responsibility as well to monitor narcotic medication use and report the amount and frequency you use these medications when you come to your office visit.   We would also advise that if you are using narcotic medications, you should take a dose prior to therapy to maximize you participation.  IF YOU ARE ON NARCOTIC MEDICATIONS IT IS NOT PERMISSIBLE TO OPERATE A MOTOR VEHICLE (MOTORCYCLE/CAR/TRUCK/MOPED) OR HEAVY MACHINERY DO NOT MIX NARCOTICS WITH OTHER CNS (CENTRAL NERVOUS SYSTEM) DEPRESSANTS SUCH AS ALCOHOL   STOP SMOKING OR USING NICOTINE PRODUCTS!!!!  As discussed nicotine severely  impairs your body's ability to heal surgical and traumatic wounds but also impairs bone healing.  Wounds and bone heal by forming microscopic blood vessels (angiogenesis) and nicotine is a vasoconstrictor (essentially, shrinks blood vessels).  Therefore, if vasoconstriction occurs to these microscopic blood vessels they essentially disappear and are unable to deliver necessary nutrients to the healing tissue.  This is one modifiable factor that you can do to dramatically increase your chances of healing your injury.    (This means no smoking, no nicotine gum, patches, etc)  DO NOT USE NONSTEROIDAL ANTI-INFLAMMATORY DRUGS (NSAID'S)  Using products such as Advil (ibuprofen), Aleve (naproxen), Motrin (ibuprofen) for additional pain control during fracture healing can delay and/or prevent the healing response.  If you would like to take over the counter (OTC) medication, Tylenol (acetaminophen) is ok.  However, some narcotic medications that are given for pain control contain acetaminophen as well. Therefore, you should not exceed more than 4000 mg of tylenol in a day if you do not have liver disease.  Also note that there are may OTC medicines, such as cold medicines and allergy medicines that my contain tylenol as well.  If you have any questions about medications and/or interactions please ask your doctor/PA or your pharmacist.      ICE AND ELEVATE INJURED/OPERATIVE EXTREMITY  Using ice and elevating the injured extremity above your heart can help with swelling and pain control.  Icing in a pulsatile fashion, such as 20 minutes on and 20 minutes off, can be followed.    Do not place ice directly on skin. Make sure there is a barrier between to skin and the  ice pack.    Using frozen items such as frozen peas works well as the conform nicely to the are that needs to be iced.  USE AN ACE WRAP OR TED HOSE FOR SWELLING CONTROL  In addition to icing and elevation, Ace wraps or TED hose are used to help limit  and resolve swelling.  It is recommended to use Ace wraps or TED hose until you are informed to stop.    When using Ace Wraps start the wrapping distally (farthest away from the body) and wrap proximally (closer to the body)   Example: If you had surgery on your leg or thing and you do not have a splint on, start the ace wrap at the toes and work your way up to the thigh        If you had surgery on your upper extremity and do not have a splint on, start the ace wrap at your fingers and work your way up to the upper arm  IF YOU ARE IN A CAM BOOT (BLACK BOOT)  You may remove boot periodically. Perform daily dressing changes as noted below.  Wash the liner of the boot regularly and wear a sock when wearing the boot. It is recommended that you sleep in the boot until told otherwise  CALL THE OFFICE WITH ANY QUESTIONS OR CONCERNS: [phone]

## 2018-10-05 NOTE — Evaluation (Signed)
Occupational Therapy Evaluation Patient Details Name: Christopher Garrett MRN: [id number] DOB: [birth year] Today's Date: 10/05/2018    History of Present Illness 32 yo admitted after hit by piece of metal at work with right tibia fx s/p IM nail. No significant PMHx   Clinical Impression   PTA, pt was living with his wife and was independent. Pt currently requiring Min A for LB ADLs and Min Guard A for functional transfers. Providing education on LB ADLs, toilet transfers, and tub transfer with 3N1 (facing forward). Pt demonstrating good adherence to WB static. Pt would benefit from further acute OT to facilitate safe dc. Recommend dc to home once medically stable per physician.     Follow Up Recommendations  No OT follow up;Supervision/Assistance - 24 hour    Equipment Recommendations  3 in 1 bedside commode    Recommendations for Other Services PT consult     Precautions / Restrictions Precautions Precautions: Fall Restrictions Weight Bearing Restrictions: Yes RLE Weight Bearing: Non weight bearing Other Position/Activity Restrictions: Formal order NWB. Per Dr. Jena Gauss Op note, touchdown weightbearing. Adhering to NWB throughout      Mobility Bed Mobility Overal bed mobility: Needs Assistance Bed Mobility: Supine to Sit     Supine to sit: Min assist     General bed mobility comments: Min A to assist with RLE  Transfers Overall transfer level: Needs assistance Equipment used: Rolling walker (2 wheeled) Transfers: Sit to/from Stand Sit to Stand: Min guard         General transfer comment: Min Guard A for safety. Education on hand placement    Balance Overall balance assessment: Needs assistance Sitting-balance support: No upper extremity supported;Feet supported Sitting balance-Leahy Scale: Fair     Standing balance support: Bilateral upper extremity supported;During functional activity Standing balance-Leahy Scale: Poor Standing balance comment: Reliant  on UE support in standing                           ADL either performed or assessed with clinical judgement   ADL Overall ADL's : Needs assistance/impaired Eating/Feeding: Set up;Sitting   Grooming: Set up;Sitting   Upper Body Bathing: Set up;Sitting   Lower Body Bathing: Minimal assistance;Sit to/from stand   Upper Body Dressing : Set up;Sitting   Lower Body Dressing: Minimal assistance;With caregiver independent assisting;Sit to/from stand Lower Body Dressing Details (indicate cue type and reason): Min Guard A for safety in standing with RW during LB dressing. Mother assisting to don shorts. Educating pt donning right first.  Toilet Transfer: Min guard;Stand-pivot;RW(Simulated to Investment banker, corporate Details (indicate cue type and reason): Min Guard A for safety. Educating pt on use of BSC over toielt to elevate and provide arm rests     Tub/ Engineer, structural: Tub transfer;Min guard;3 in Chiropodist Details (indicate cue type and reason): Educating pt on use of 3N1 in tub facing outward to adhere to WB status and maintain elevated RLE Functional mobility during ADLs: Min guard;Rolling walker General ADL Comments: Providing education on WB status, LB dressing, toileting, and tub transfer     Vision         Perception     Praxis      Pertinent Vitals/Pain Pain Assessment: 0-10 Pain Score: 3  Pain Location: RLE Pain Descriptors / Indicators: Sore Pain Intervention(s): Monitored during session;Limited activity within patient's tolerance;Repositioned     Hand Dominance     Extremity/Trunk Assessment Upper Extremity Assessment Upper Extremity Assessment: Overall  WFL for tasks assessed   Lower Extremity Assessment Lower Extremity Assessment: RLE deficits/detail RLE Deficits / Details: limited strength and ROM due to pain, needs assist to lift leg from chair   Cervical / Trunk Assessment Cervical / Trunk Assessment:  Normal   Communication Communication Communication: Prefers language other than English(spanish 1st language, ESL)   Cognition Arousal/Alertness: Awake/alert Behavior During Therapy: WFL for tasks assessed/performed Overall Cognitive Status: Within Functional Limits for tasks assessed                                 General Comments: highly motivated   General Comments  family present throughout session    Exercises     Shoulder Instructions      Home Living Family/patient expects to be discharged to:: Private residence Living Arrangements: Spouse/significant other;Children Available Help at Discharge: Family;Available 24 hours/day Type of Home: House Home Access: Stairs to enter [employer] of Steps: 2 Entrance Stairs-Rails: None Home Layout: One level     Bathroom Shower/Tub: Chief Strategy Officer: Standard     Home Equipment: None          Prior Functioning/Environment Level of Independence: Independent                 OT Problem List: Decreased strength;Decreased range of motion;Decreased activity tolerance;Impaired balance (sitting and/or standing);Decreased knowledge of use of DME or AE;Decreased knowledge of precautions;Pain      OT Treatment/Interventions: Self-care/ADL training;Therapeutic exercise;Energy conservation;DME and/or AE instruction;Therapeutic activities;Patient/family education    OT Goals(Current goals can be found in the care plan section) Acute Rehab OT Goals Patient Stated Goal: return home OT Goal Formulation: With patient Time For Goal Achievement: 10/19/18 Potential to Achieve Goals: Good ADL Goals Pt Will Perform Lower Body Dressing: with set-up;with supervision;sit to/from stand Pt Will Transfer to Toilet: with set-up;with supervision;bedside commode;ambulating Pt Will Perform Tub/Shower Transfer: Tub transfer;3 in 1;ambulating;rolling walker(3N1 facing forward)  OT Frequency: Min  2X/week   Barriers to D/C:            Co-evaluation              AM-PAC PT "6 Clicks" Daily Activity     Outcome Measure Help from another person eating meals?: None Help from another person taking care of personal grooming?: None Help from another person toileting, which includes using toliet, bedpan, or urinal?: A Little Help from another person bathing (including washing, rinsing, drying)?: A Little Help from another person to put on and taking off regular upper body clothing?: None Help from another person to put on and taking off regular lower body clothing?: A Little 6 Click Score: 21   End of Session Equipment Utilized During Treatment: Rolling walker Nurse Communication: Mobility status;Weight bearing status  Activity Tolerance: Patient tolerated treatment well Patient left: in chair;with call bell/phone within reach;with family/visitor present  OT Visit Diagnosis: Unsteadiness on feet (R26.81);Other abnormalities of gait and mobility (R26.89);Muscle weakness (generalized) (M62.81);Pain Pain - Right/Left: Right Pain - part of body: Leg                Time: 1050-1109 OT Time Calculation (min): 19 min Charges:  OT General Charges $OT Visit: 1 Visit OT Evaluation $OT Eval Low Complexity: 1 Low  Debbie Yearick MSOT, OTR/L Acute Rehab Pager: [phone] Office: [phone]  Theodoro Grist Julena Barbour 10/05/2018, 12:23 PM

## 2018-10-06 NOTE — Progress Notes (Signed)
Orthopedic Tech Progress Note Patient Details:  Christopher Garrett 1986-01-31 [id number]  Ortho Devices Type of Ortho Device: CAM walker Ortho Device/Splint Location: rle Ortho Device/Splint Interventions: Application   Post Interventions Patient Tolerated: Well Instructions Provided: Care of device   [name] 10/06/2018, 10:09 AM

## 2018-10-06 NOTE — Progress Notes (Signed)
Physical Therapy Treatment Patient Details Name: Christopher Garrett MRN: [id number] DOB: 1986/03/03 Today's Date: 10/06/2018    History of Present Illness 32 yo admitted after hit by piece of metal at work with right tibia fx s/p IM nail. No significant PMHx    PT Comments    Patient received up in chair, very pleasant and willing to participate in PT session. Able to complete functional transfers with RW and min guard, then gait trained 263fx2 with RW today, occasional cues for placement of RW. Continued practicing stair navigation with RW, able to complete 2 steps with RW today and min guard. He was left up in his chair with all needs met and questions/concerns addressed this morning.    Follow Up Recommendations  No PT follow up     Equipment Recommendations  Rolling walker with 5" wheels    Recommendations for Other Services       Precautions / Restrictions Precautions Precautions: Fall Restrictions Weight Bearing Restrictions: Yes RLE Weight Bearing: Non weight bearing Other Position/Activity Restrictions: Formal order NWB. Per Dr. HDoreatha MartinOp note, touchdown weightbearing. Adhering to NWB throughout    Mobility  Bed Mobility Overal bed mobility: Needs Assistance Bed Mobility: Supine to Sit     Supine to sit: Min guard     General bed mobility comments: OOB in chair   Transfers Overall transfer level: Needs assistance Equipment used: Rolling walker (2 wheeled) Transfers: Sit to/from Stand Sit to Stand: Min guard         General transfer comment: min guard for safety, no cues or physical assist given   Ambulation/Gait Ambulation/Gait assistance: Supervision Gait Distance (Feet): 250 Feet(x2) Assistive device: Rolling walker (2 wheeled) Gait Pattern/deviations: Step-to pattern     General Gait Details: cues for appropriate positioning of RW to maintain NWB; able to ambulate 2533fx2    Stairs   Stairs assistance: Min guard Stair Management:  Backwards;With walker Number of Stairs: 2 General stair comments: min guard for safety, good awareness of sequencing today    Wheelchair Mobility    Modified Rankin (Stroke Patients Only)       Balance Overall balance assessment: Needs assistance Sitting-balance support: No upper extremity supported;Feet supported Sitting balance-Leahy Scale: Good     Standing balance support: Bilateral upper extremity supported;During functional activity Standing balance-Leahy Scale: Fair Standing balance comment: Reliant on UE support in standing                            Cognition Arousal/Alertness: Awake/alert Behavior During Therapy: WFL for tasks assessed/performed Overall Cognitive Status: Within Functional Limits for tasks assessed                                 General Comments: highly motivated      Exercises      General Comments General comments (skin integrity, edema, etc.): RN stating that he will be getting CAM boot, not present in room yet      Pertinent Vitals/Pain Pain Assessment: 0-10 Pain Score: 2  Pain Location: RLE Pain Descriptors / Indicators: Sore Pain Intervention(s): Premedicated before session;Monitored during session;Limited activity within patient's tolerance    Home Living                      Prior Function            PT Goals (current goals can  now be found in the care plan section) Acute Rehab PT Goals Patient Stated Goal: return home PT Goal Formulation: With patient Time For Goal Achievement: 10/12/18 Potential to Achieve Goals: Good Progress towards PT goals: Progressing toward goals    Frequency    Min 4X/week      PT Plan Current plan remains appropriate    Co-evaluation              AM-PAC PT "6 Clicks" Daily Activity  Outcome Measure  Difficulty turning over in bed (including adjusting bedclothes, sheets and blankets)?: A Little Difficulty moving from lying on back to sitting  on the side of the bed? : A Little Difficulty sitting down on and standing up from a chair with arms (e.g., wheelchair, bedside commode, etc,.)?: A Little Help needed moving to and from a bed to chair (including a wheelchair)?: A Little Help needed walking in hospital room?: A Little Help needed climbing 3-5 steps with a railing? : A Little 6 Click Score: 18    End of Session   Activity Tolerance: Patient tolerated treatment well Patient left: in chair;with call bell/phone within reach   PT Visit Diagnosis: Other abnormalities of gait and mobility (R26.89)     Time: 6286-3817 PT Time Calculation (min) (ACUTE ONLY): 20 min  Charges:  $Gait Training: 8-22 mins                     Deniece Ree PT, DPT, CBIS  Supplemental Physical Therapist [place]    Pager [phone] Acute Rehab Office [phone]

## 2018-10-06 NOTE — Progress Notes (Signed)
Occupational Therapy Treatment Patient Details Name: Christopher Garrett MRN: [id number] DOB: 1986-10-20 Today's Date: 10/06/2018    History of present illness 32 yo admitted after hit by piece of metal at work with right tibia fx s/p IM nail. No significant PMHx   OT comments  Pt progressing towards OT goals. Pt able to perform teach back on 3 in 1 after education, improved bed mobility with leg lifter (provided) and good safety awareness in transfers with hand placement. Excellent maintenance of NWB status, waiting on CAM boot per RN. Current POC remains appropriate.    Follow Up Recommendations  No OT follow up;Supervision/Assistance - 24 hour    Equipment Recommendations  3 in 1 bedside commode;Other (comment)(leg lifter (provided))    Recommendations for Other Services PT consult    Precautions / Restrictions Precautions Precautions: Fall Restrictions Weight Bearing Restrictions: Yes RLE Weight Bearing: Non weight bearing Other Position/Activity Restrictions: Formal order NWB. Per Dr. Jena Gauss Op note, touchdown weightbearing. Adhering to NWB throughout       Mobility Bed Mobility Overal bed mobility: Needs Assistance Bed Mobility: Supine to Sit     Supine to sit: Min guard     General bed mobility comments: Pt provided with leg lifter, Pt able to perform without physical assist today  Transfers Overall transfer level: Needs assistance Equipment used: Rolling walker (2 wheeled) Transfers: Sit to/from Stand Sit to Stand: Min guard         General transfer comment: Min Guard A for safety. Education on hand placement    Balance Overall balance assessment: Needs assistance Sitting-balance support: No upper extremity supported;Feet supported Sitting balance-Leahy Scale: Fair     Standing balance support: Bilateral upper extremity supported;During functional activity Standing balance-Leahy Scale: Poor Standing balance comment: Reliant on UE support in  standing                           ADL either performed or assessed with clinical judgement   ADL Overall ADL's : Needs assistance/impaired Eating/Feeding: Set up;Sitting Eating/Feeding Details (indicate cue type and reason): in recliner at end of session         Lower Body Bathing: Minimal assistance;Sit to/from stand Lower Body Bathing Details (indicate cue type and reason): educated on use of 3 in 1 for shower     Lower Body Dressing: Minimal assistance;With caregiver independent assisting;Sit to/from stand Lower Body Dressing Details (indicate cue type and reason): Re-Educated pt donning right first.  Toilet Transfer: Ambulation;RW;Min guard(Simulated to recliner) Toilet Transfer Details (indicate cue type and reason): educated on 3 in 1 uses, demonstrating adjustments     Tub/ Shower Transfer: Tub transfer;Min guard;3 in Chiropodist Details (indicate cue type and reason): Educating pt on use of 3N1 in tub facing outward to adhere to WB status and maintain elevated RLE Functional mobility during ADLs: Min guard;Rolling walker General ADL Comments: Providing education on WB status, LB dressing, toileting, and tub transfer     Vision       Perception     Praxis      Cognition Arousal/Alertness: Awake/alert Behavior During Therapy: WFL for tasks assessed/performed Overall Cognitive Status: Within Functional Limits for tasks assessed                                 General Comments: highly motivated        Exercises  Shoulder Instructions       General Comments RN stating that he will be getting CAM boot, not present in room yet    Pertinent Vitals/ Pain       Pain Assessment: 0-10 Pain Score: 2  Pain Location: RLE Pain Descriptors / Indicators: Sore Pain Intervention(s): Monitored during session;Repositioned;Premedicated before session  Home Living                                           Prior Functioning/Environment              Frequency  Min 2X/week        Progress Toward Goals  OT Goals(current goals can now be found in the care plan section)  Progress towards OT goals: Progressing toward goals  Acute Rehab OT Goals Patient Stated Goal: return home OT Goal Formulation: With patient Time For Goal Achievement: 10/19/18 Potential to Achieve Goals: Good  Plan Discharge plan remains appropriate    Co-evaluation                 AM-PAC PT "6 Clicks" Daily Activity     Outcome Measure   Help from another person eating meals?: None Help from another person taking care of personal grooming?: None Help from another person toileting, which includes using toliet, bedpan, or urinal?: A Little Help from another person bathing (including washing, rinsing, drying)?: A Little Help from another person to put on and taking off regular upper body clothing?: None Help from another person to put on and taking off regular lower body clothing?: A Little 6 Click Score: 21    End of Session Equipment Utilized During Treatment: Rolling walker;Gait belt  OT Visit Diagnosis: Unsteadiness on feet (R26.81);Other abnormalities of gait and mobility (R26.89);Muscle weakness (generalized) (M62.81);Pain Pain - Right/Left: Right Pain - part of body: Leg   Activity Tolerance Patient tolerated treatment well   Patient Left in chair;with call bell/phone within reach   Nurse Communication Mobility status;Weight bearing status        Time: 1610-9604 OT Time Calculation (min): 19 min  Charges: OT General Charges $OT Visit: 1 Visit OT Treatments $Self Care/Home Management : 8-22 mins  Sherryl Manges OTR/L Acute Rehabilitation Services Pager: [phone] Office: [phone]   Christopher Garrett 10/06/2018, 9:38 AM

## 2018-10-06 NOTE — Anesthesia Postprocedure Evaluation (Signed)
Anesthesia Post Note  Patient: Christopher Garrett  Procedure(s) Performed: INTRAMEDULLARY (IM) NAIL TIBIAL (Right Leg Lower)     Patient location during evaluation: PACU Anesthesia Type: General Level of consciousness: awake and alert Pain management: pain level controlled Vital Signs Assessment: post-procedure vital signs reviewed and stable Respiratory status: spontaneous breathing, nonlabored ventilation, respiratory function stable and patient connected to nasal cannula oxygen Cardiovascular status: blood pressure returned to baseline and stable Postop Assessment: no apparent nausea or vomiting Anesthetic complications: no    Last Vitals:  Vitals:   10/06/18 0501 10/06/18 1259  BP: (!) 151/91 126/80  Pulse: 90 80  Resp: 20 16  Temp: (!) 36.4 C 36.9 C  SpO2: 100% 100%    Last Pain:  Vitals:   10/06/18 1259  TempSrc: Oral  PainSc:                  Lacresia Darwish

## 2018-10-06 NOTE — Discharge Summary (Signed)
Orthopaedic Trauma Service (OTS)  Patient ID: Christopher Garrett MRN: [id number] DOB/AGE: [birth year] 30 y.o.  Admit date: 10/04/2018 Discharge date: 10/06/2018  Admission Diagnoses:Closed displaced segmental fracture of shaft of right tibia  Discharge Diagnoses:  Active Problems:   Closed displaced segmental fracture of shaft of right tibia   History reviewed. No pertinent past medical history.   Procedures Performed: 10/04/2018: CPT 27759-Intramedullary nailing of right tibial shaft fracture  Discharged Condition: good  Hospital Course: The patient was admitted and urgently took him to the operating room for the above procedure.  He tolerated this well.  He was able to mobilize with physical and occupational therapy.  Eventually he was discharged home on postoperative day 2.  His dressings were changed on postoperative day 2.  Upon discharge he was tolerating regular diet, voiding spontaneously and pain was well controlled with oral medications.  Consults: None  Significant Diagnostic Studies: None  Treatments: surgery: as above  Discharge Exam:  General: No acute distress awake alert and oriented x3 Right lower extremity: Incisions are clean dry and intact compartments are compressible but swollen.  He has no pain with passive stretch.  He is active dorsiflexion plantarflexion of his ankle and great toes.  Disposition: Discharge disposition: 01-Home or Self Care        Allergies as of 10/06/2018   No Known Allergies     Medication List    TAKE these medications   aspirin 325 MG tablet Take 1 tablet (325 mg total) by mouth daily.   methocarbamol 500 MG tablet Commonly known as:  ROBAXIN Take 1 tablet (500 mg total) by mouth every 6 (six) hours as needed for muscle spasms.   oxyCODONE-acetaminophen 5-325 MG tablet Commonly known as:  PERCOCET/ROXICET Take 1 tablet by mouth every 4 (four) hours as needed for moderate pain.            Durable  Medical Equipment  (From admission, onward)         Start     Ordered   10/05/18 1152  For home use only DME 3 n 1  Once     10/05/18 1151   10/05/18 1151  For home use only DME Walker rolling  Once    Question:  Patient needs a walker to treat with the following condition  Answer:  Tibia/fibula fracture   10/05/18 1151         Follow-up Information    Haddix, Gillie Manners, MD. Schedule an appointment as soon as possible for a visit in 2 week(s).   Specialty:  Orthopedic Surgery Contact information: [address] [place] [address] [place] [address] [phone]           Discharge Instructions and Plan: Patient will be nonweightbearing for the right lower extremity.  He will be in a boot and start range of motion of the ankle and knee.  Return in 2 weeks with repeat x-rays and suture removal.  Signed:  Roby Lofts, MD [facility] 10/06/2018, 6:51 AM

## 2018-10-06 NOTE — Plan of Care (Signed)
  Problem: Pain Managment: Goal: General experience of comfort will improve Outcome: Progressing   

## 2019-07-06 ENCOUNTER — Ambulatory Visit: Payer: Self-pay | Admitting: Student

## 2019-07-06 DIAGNOSIS — S82201A Unspecified fracture of shaft of right tibia, initial encounter for closed fracture: Secondary | ICD-10-CM | POA: Insufficient documentation

## 2019-07-06 DIAGNOSIS — S82401A Unspecified fracture of shaft of right fibula, initial encounter for closed fracture: Secondary | ICD-10-CM | POA: Insufficient documentation

## 2019-07-06 DIAGNOSIS — T8484XA Pain due to internal orthopedic prosthetic devices, implants and grafts, initial encounter: Secondary | ICD-10-CM | POA: Insufficient documentation

## 2019-07-26 ENCOUNTER — Inpatient Hospital Stay (HOSPITAL_COMMUNITY)
Admission: RE | Admit: 2019-07-26 | Discharge: 2019-07-26 | Disposition: A | Discharge status: Home / Self Care | Payer: Self-pay | Source: Ambulatory Visit

## 2019-07-26 ENCOUNTER — Other Ambulatory Visit (HOSPITAL_COMMUNITY): Payer: Self-pay

## 2019-07-26 NOTE — Progress Notes (Signed)
[id number] - [place], [place] - [address] [place] [address] Phone: [phone] Fax: [phone]  [id number] [place] [place]- [place], [place] - [address] [facility] [address] [facility] [facility] [address] [place] [address] Phone: [phone] Fax: [phone]    Your procedure is scheduled on Friday, August 14th.  [facility]" at 5:30 A.M., and check in at the Admitting office.  Call this number if you have problems the morning of surgery:  [phone]  Call [phone] if you have any questions prior to your surgery date Monday-Friday 8am-4pm   Remember:  Do not eat after midnight the night before your surgery  You may drink clear liquids until 4:30 the morning of your surgery.   Clear liquids allowed are: Water, Non-Citrus Juices (without pulp), Carbonated Beverages, Clear Tea, Black Coffee Only, and Gatorade  Please complete your PRE-SURGERY ENSURE that was provided to you by 4:30 the morning of surgery. Please, if able, drink it in one setting. DO NOT SIP.    Take these medicines the morning of surgery with A SIP OF WATER   If needed - acetaminophen (TYLENOL)  7 days prior to surgery STOP taking any Aspirin (unless otherwise instructed by your surgeon), Aleve, Naproxen, Ibuprofen, Motrin, Advil, Goody's, BC's, all herbal medications, fish oil, and all vitamins.   The Morning of Surgery  Do not wear jewelry, make-up or nail polish.  Do not wear lotions, powders, or perfumes/colognes, or deodorant  Do not shave 48 hours prior to surgery.  Men may shave face and neck.  Do not bring valuables to the hospital.  [facility] is not responsible for any belongings or valuables.  If you are a smoker, DO NOT Smoke 24 hours prior to surgery IF you wear a CPAP at night please bring your mask, tubing, and machine the morning of surgery   Remember that you must have someone to transport you home after your surgery, and remain with  you for 24 hours if you are discharged the same day.  Contacts, glasses, hearing aids, dentures or bridgework may not be worn into surgery.   Leave your suitcase in the car.  After surgery it may be brought to your room.  For patients admitted to the hospital, discharge time will be determined by your treatment team.  Patients discharged the day of surgery will not be allowed to drive home.   Special instructions:   [facility]- Preparing For Surgery  Before surgery, you can play an important role. Because skin is not sterile, your skin needs to be as free of germs as possible. You can reduce the number of germs on your skin by washing with CHG (chlorahexidine gluconate) Soap before surgery.  CHG is an antiseptic cleaner which kills germs and bonds with the skin to continue killing germs even after washing.    Oral Hygiene is also important to reduce your risk of infection.  Remember - BRUSH YOUR TEETH THE MORNING OF SURGERY WITH YOUR REGULAR TOOTHPASTE  Please do not use if you have an allergy to CHG or antibacterial soaps. If your skin becomes reddened/irritated stop using the CHG.  Do not shave (including legs and underarms) for at least 48 hours prior to first CHG shower. It is OK to shave your face.  Please follow these instructions carefully.   1. Shower the NIGHT BEFORE SURGERY and the MORNING OF SURGERY with CHG Soap.  2. If you chose to wash your hair, wash your hair first as usual with your normal shampoo.  3. After you shampoo, rinse your hair and body thoroughly to remove the shampoo.  4. Use CHG as you would any other liquid soap. You can apply CHG directly to the skin and wash gently with a scrungie or a clean washcloth.   5. Apply the CHG Soap to your body ONLY FROM THE NECK DOWN.  Do not use on open wounds or open sores. Avoid contact with your eyes, ears, mouth and genitals (private parts). Wash Face and genitals (private parts)  with your normal soap.   6. Wash  thoroughly, paying special attention to the area where your surgery will be performed.  7. Thoroughly rinse your body with warm water from the neck down.  8. DO NOT shower/wash with your normal soap after using and rinsing off the CHG Soap.  9. Pat yourself dry with a CLEAN TOWEL.  10. Wear CLEAN PAJAMAS to bed the night before surgery, wear comfortable clothes the morning of surgery  11. Place CLEAN SHEETS on your bed the night of your first shower and DO NOT SLEEP WITH PETS.  Day of Surgery:  Do not apply any deodorants/lotions. Please shower the morning of surgery with the CHG soap  Please wear clean clothes to the hospital/surgery center.   Remember to brush your teeth WITH YOUR REGULAR TOOTHPASTE.  Please read over the following fact sheets that you were given.

## 2019-07-27 NOTE — H&P (Signed)
Orthopaedic Trauma Service (OTS) H&P   Patient ID: Christopher Garrett MRN: [id number] DOB/AGE: Nov 24, 1986 33 y.o.  Reason for Surgery: Painful orthopaedic hardware right tibia   HPI: Christopher Garrett is an 33 y.o. male presenting for surgery of right tibia. Patient had a 600 pound beam fall on him and struck him in the right leg. He had immediate pain and deformity. X-rays showed a segmental right tibial shaft fracture. Was treated operatively with intramedullary nail. Fracture has now healed and patient presenting with continued pain over distal interlocking screws as well as occasional proximal tibia/ knee pain. Is requesting removal of all hardware in leg.   Not currently on any medications. Pre-operative Covid testing negative.   No past medical history on file.  Past Surgical History:  Procedure Laterality Date  . TIBIA IM NAIL INSERTION Right 10/04/2018   Procedure: INTRAMEDULLARY (IM) NAIL TIBIAL;  Surgeon: Shona Needles, MD;  Location: [facility];  Service: Orthopedics;  Laterality: Right;    No family history on file.  Social History:  reports that he has never smoked. He has never used smokeless tobacco. He reports that he does not drink alcohol or use drugs.  Allergies: No Known Allergies  Medications: I have reviewed the patient's current medications.  ROS: Constitutional: No fever or chills Vision: No changes in vision ENT: No difficulty swallowing CV: No chest pain Pulm: No SOB or wheezing GI: No nausea or vomiting GU: No urgency or inability to hold urine Skin: No poor wound healing Neurologic: No numbness or tingling Psychiatric: No depression or anxiety Heme: No bruising Allergic: No reaction to medications or food   Exam: There were no vitals taken for this visit. General: NAD Orientation: Alert and oriented Mood and Affect: Pleasant and cooperative Gait: within normal limits Coordination and balance: Within normal limits  Right Lower  Extremity: Well healed incisions. Tender with palpation over distal interlocking screw which is prominent under the skin on exam. Full knee ROM without significant discomfort. Ankle dorsiflexion/planatflexion intact. Sensation grossly intact distally. Neurovascularly intact  Left Lower Extremity: Skin without lesions. No tenderness to palpation. Full painless ROM, full strength in each muscle groups without evidence of instability.   Medical Decision Making: Imaging: Full healed right tibial shaft fracture. IMN in place.  Labs: No results found for this or any previous visit (from the past 24 hour(s)).   Medical history and chart was reviewed  Assessment/Plan: 33 year old male with pain orthopaedic hardware s/p intramedullary nailing of right tibial shaft fracture 09/2018.  Right tibial shaft fracture has fully healed at this point, I feel it is appropriate to proceed with removal of hardware. Risks and benefits discussed. Patient agrees to proceed with surgery. Plan for discharge after surgery.  Kishana Battey A. Carmie Kanner [facility] ?[phone]? (phone)

## 2019-07-28 ENCOUNTER — Encounter
Admission: RE | Admit: 2019-07-28 | Discharge: 2019-07-28 | Disposition: A | Discharge status: Home / Self Care | Payer: HRSA Program | Source: Ambulatory Visit | Attending: Student | Admitting: Student

## 2019-07-28 ENCOUNTER — Other Ambulatory Visit: Payer: Self-pay

## 2019-07-28 ENCOUNTER — Encounter (HOSPITAL_COMMUNITY): Payer: Self-pay | Admitting: *Deleted

## 2019-07-28 DIAGNOSIS — T8484XA Pain due to internal orthopedic prosthetic devices, implants and grafts, initial encounter: Secondary | ICD-10-CM | POA: Insufficient documentation

## 2019-07-28 DIAGNOSIS — Z20828 Contact with and (suspected) exposure to other viral communicable diseases: Secondary | ICD-10-CM | POA: Insufficient documentation

## 2019-07-28 DIAGNOSIS — Z01812 Encounter for preprocedural laboratory examination: Secondary | ICD-10-CM | POA: Diagnosis not present

## 2019-07-28 LAB — SARS CORONAVIRUS 2 BY RT PCR (HOSPITAL ORDER, PERFORMED IN ~~LOC~~ HOSPITAL LAB): SARS Coronavirus 2: NEGATIVE

## 2019-07-28 NOTE — Progress Notes (Signed)
Spoke with patient for pre-op call via [employer], [place] #[id number]. Pt denies cardiac history, HTN or Diabetes.   Pt will go for his Covid test today at [facility]. Pt given quarantine instructions and voiced understanding.   Coronavirus Screening  Have you experienced the following symptoms:  Cough NO Fever (>100.79F)  NO Runny nose NO Sore throat NO Difficulty breathing/shortness of breath  NO  Have you or a family member traveled in the last 14 days and where? NO  Patient reminded that hospital visitation restrictions are in effect and the importance of the restrictions. Informed pt that he may 1 visitor wait in the waiting room while he is in pre-op, surgery and PACU. Pt voiced understanding.

## 2019-07-29 ENCOUNTER — Other Ambulatory Visit: Payer: Self-pay

## 2019-07-29 ENCOUNTER — Encounter (HOSPITAL_COMMUNITY)
Admission: RE | Disposition: A | Discharge status: Home / Self Care | Payer: Self-pay | Source: Home / Self Care | Attending: Student

## 2019-07-29 ENCOUNTER — Ambulatory Visit (HOSPITAL_COMMUNITY): Payer: Self-pay | Attending: Student

## 2019-07-29 ENCOUNTER — Encounter (HOSPITAL_COMMUNITY): Payer: Self-pay | Admitting: Anesthesiology

## 2019-07-29 ENCOUNTER — Ambulatory Visit (HOSPITAL_COMMUNITY)
Admission: RE | Admit: 2019-07-29 | Discharge: 2019-07-29 | Disposition: A | Discharge status: Home / Self Care | Payer: Worker's Compensation | Attending: Student | Admitting: Student

## 2019-07-29 ENCOUNTER — Ambulatory Visit (HOSPITAL_COMMUNITY): Payer: Worker's Compensation | Admitting: Anesthesiology

## 2019-07-29 ENCOUNTER — Ambulatory Visit (HOSPITAL_COMMUNITY): Payer: Self-pay

## 2019-07-29 DIAGNOSIS — Y793 Surgical instruments, materials and orthopedic devices (including sutures) associated with adverse incidents: Secondary | ICD-10-CM | POA: Diagnosis not present

## 2019-07-29 DIAGNOSIS — R000 Tachycardia, unspecified: Secondary | ICD-10-CM | POA: Insufficient documentation

## 2019-07-29 DIAGNOSIS — T8484XA Pain due to internal orthopedic prosthetic devices, implants and grafts, initial encounter: Secondary | ICD-10-CM | POA: Diagnosis not present

## 2019-07-29 DIAGNOSIS — Z419 Encounter for procedure for purposes other than remedying health state, unspecified: Secondary | ICD-10-CM

## 2019-07-29 DIAGNOSIS — X58XXXD Exposure to other specified factors, subsequent encounter: Secondary | ICD-10-CM | POA: Insufficient documentation

## 2019-07-29 DIAGNOSIS — S82401D Unspecified fracture of shaft of right fibula, subsequent encounter for closed fracture with routine healing: Secondary | ICD-10-CM | POA: Insufficient documentation

## 2019-07-29 DIAGNOSIS — R9431 Abnormal electrocardiogram [ECG] [EKG]: Secondary | ICD-10-CM | POA: Insufficient documentation

## 2019-07-29 DIAGNOSIS — T148XXA Other injury of unspecified body region, initial encounter: Secondary | ICD-10-CM

## 2019-07-29 DIAGNOSIS — S82201D Unspecified fracture of shaft of right tibia, subsequent encounter for closed fracture with routine healing: Secondary | ICD-10-CM | POA: Insufficient documentation

## 2019-07-29 DIAGNOSIS — S82201A Unspecified fracture of shaft of right tibia, initial encounter for closed fracture: Secondary | ICD-10-CM | POA: Insufficient documentation

## 2019-07-29 DIAGNOSIS — S82401A Unspecified fracture of shaft of right fibula, initial encounter for closed fracture: Secondary | ICD-10-CM | POA: Insufficient documentation

## 2019-07-29 HISTORY — DX: Other specified health status: Z78.9

## 2019-07-29 HISTORY — DX: Other complications of anesthesia, initial encounter: T88.59XA

## 2019-07-29 HISTORY — PX: HARDWARE REMOVAL: SHX979

## 2019-07-29 LAB — HEMOGLOBIN: Hemoglobin: 14.6 g/dL (ref 13.0–17.0)

## 2019-07-29 SURGERY — REMOVAL, HARDWARE
Anesthesia: General | Laterality: Right

## 2019-07-29 MED ORDER — LACTATED RINGERS IV SOLN
INTRAVENOUS | Status: DC | PRN
  Administered 2019-07-29 (×2): via INTRAVENOUS

## 2019-07-29 MED ORDER — VANCOMYCIN HCL 1000 MG IV SOLR
INTRAVENOUS | Status: DC | PRN
  Administered 2019-07-29: 1000 mg via TOPICAL

## 2019-07-29 MED ORDER — BUPIVACAINE HCL (PF) 0.5 % IJ SOLN
INTRAMUSCULAR | Status: AC
  Filled 2019-07-29: qty 30

## 2019-07-29 MED ORDER — MIDAZOLAM HCL 2 MG/2ML IJ SOLN
INTRAMUSCULAR | Status: AC
  Filled 2019-07-29: qty 2

## 2019-07-29 MED ORDER — MEPERIDINE HCL 25 MG/ML IJ SOLN: 6.2500 mg | INTRAMUSCULAR | Status: DC | PRN

## 2019-07-29 MED ORDER — OXYCODONE HCL 5 MG PO TABS: 5.0000 mg | ORAL_TABLET | ORAL | 0 refills | Status: AC | PRN

## 2019-07-29 MED ORDER — OXYCODONE HCL 5 MG PO TABS
5.0000 mg | ORAL_TABLET | Freq: Once | ORAL | Status: AC | PRN
  Administered 2019-07-29: 5 mg via ORAL

## 2019-07-29 MED ORDER — MIDAZOLAM HCL 5 MG/5ML IJ SOLN
INTRAMUSCULAR | Status: DC | PRN
  Administered 2019-07-29: 2 mg via INTRAVENOUS

## 2019-07-29 MED ORDER — VANCOMYCIN HCL 1000 MG IV SOLR
INTRAVENOUS | Status: AC
  Filled 2019-07-29: qty 1000

## 2019-07-29 MED ORDER — STERILE WATER FOR IRRIGATION IR SOLN
Status: DC | PRN
  Administered 2019-07-29: 1000 mL

## 2019-07-29 MED ORDER — PROPOFOL 10 MG/ML IV BOLUS
INTRAVENOUS | Status: DC | PRN
  Administered 2019-07-29: 180 mg via INTRAVENOUS

## 2019-07-29 MED ORDER — OXYCODONE HCL 5 MG PO TABS
ORAL_TABLET | ORAL | Status: AC
  Filled 2019-07-29: qty 1

## 2019-07-29 MED ORDER — CEFAZOLIN SODIUM-DEXTROSE 2-4 GM/100ML-% IV SOLN
2.0000 g | INTRAVENOUS | Status: AC
  Administered 2019-07-29: 2 g via INTRAVENOUS
  Filled 2019-07-29: qty 100

## 2019-07-29 MED ORDER — HYDROMORPHONE HCL 1 MG/ML IJ SOLN
0.2500 mg | INTRAMUSCULAR | Status: DC | PRN
  Administered 2019-07-29 (×2): 0.5 mg via INTRAVENOUS

## 2019-07-29 MED ORDER — LIDOCAINE 2% (20 MG/ML) 5 ML SYRINGE
INTRAMUSCULAR | Status: DC | PRN
  Administered 2019-07-29: 60 mg via INTRAVENOUS

## 2019-07-29 MED ORDER — PROPOFOL 10 MG/ML IV BOLUS
INTRAVENOUS | Status: AC
  Filled 2019-07-29: qty 20

## 2019-07-29 MED ORDER — DEXAMETHASONE SODIUM PHOSPHATE 10 MG/ML IJ SOLN
INTRAMUSCULAR | Status: DC | PRN
  Administered 2019-07-29: 10 mg via INTRAVENOUS

## 2019-07-29 MED ORDER — HYDROMORPHONE HCL 1 MG/ML IJ SOLN
INTRAMUSCULAR | Status: AC
  Filled 2019-07-29: qty 1

## 2019-07-29 MED ORDER — FENTANYL CITRATE (PF) 100 MCG/2ML IJ SOLN
INTRAMUSCULAR | Status: DC | PRN
  Administered 2019-07-29: 25 ug via INTRAVENOUS
  Administered 2019-07-29: 50 ug via INTRAVENOUS
  Administered 2019-07-29 (×2): 25 ug via INTRAVENOUS
  Administered 2019-07-29 (×2): 50 ug via INTRAVENOUS
  Administered 2019-07-29: 25 ug via INTRAVENOUS

## 2019-07-29 MED ORDER — CHLORHEXIDINE GLUCONATE 4 % EX LIQD: 60.0000 mL | Freq: Once | CUTANEOUS | Status: DC

## 2019-07-29 MED ORDER — PROMETHAZINE HCL 25 MG/ML IJ SOLN: 6.2500 mg | INTRAMUSCULAR | Status: DC | PRN

## 2019-07-29 MED ORDER — FENTANYL CITRATE (PF) 250 MCG/5ML IJ SOLN
INTRAMUSCULAR | Status: AC
  Filled 2019-07-29: qty 5

## 2019-07-29 MED ORDER — 0.9 % SODIUM CHLORIDE (POUR BTL) OPTIME
TOPICAL | Status: DC | PRN
  Administered 2019-07-29: 1000 mL

## 2019-07-29 MED ORDER — ONDANSETRON HCL 4 MG/2ML IJ SOLN
INTRAMUSCULAR | Status: DC | PRN
  Administered 2019-07-29: 4 mg via INTRAVENOUS

## 2019-07-29 MED ORDER — OXYCODONE HCL 5 MG/5ML PO SOLN: 5.0000 mg | Freq: Once | ORAL | Status: AC | PRN

## 2019-07-29 SURGICAL SUPPLY — 68 items
BANDAGE ESMARK 6X9 LF (GAUZE/BANDAGES/DRESSINGS) ×1 IMPLANT
BNDG COHESIVE 6X5 TAN STRL LF (GAUZE/BANDAGES/DRESSINGS) ×3 IMPLANT
BNDG ELASTIC 4X5.8 VLCR STR LF (GAUZE/BANDAGES/DRESSINGS) ×3 IMPLANT
BNDG ELASTIC 6X10 VLCR STRL LF (GAUZE/BANDAGES/DRESSINGS) ×2 IMPLANT
BNDG ELASTIC 6X5.8 VLCR STR LF (GAUZE/BANDAGES/DRESSINGS) ×3 IMPLANT
BNDG ESMARK 6X9 LF (GAUZE/BANDAGES/DRESSINGS) ×3
BNDG GAUZE ELAST 4 BULKY (GAUZE/BANDAGES/DRESSINGS) ×6 IMPLANT
BRUSH SCRUB EZ PLAIN DRY (MISCELLANEOUS) ×6 IMPLANT
CHLORAPREP W/TINT 26 (MISCELLANEOUS) ×3 IMPLANT
CLOSURE STERI-STRIP 1/2X4 (GAUZE/BANDAGES/DRESSINGS) ×1
CLOSURE WOUND 1/2 X4 (GAUZE/BANDAGES/DRESSINGS)
CLSR STERI-STRIP ANTIMIC 1/2X4 (GAUZE/BANDAGES/DRESSINGS) ×1 IMPLANT
COVER SURGICAL LIGHT HANDLE (MISCELLANEOUS) ×6 IMPLANT
COVER WAND RF STERILE (DRAPES) ×3 IMPLANT
CUFF TOURN SGL QUICK 18X4 (TOURNIQUET CUFF) IMPLANT
CUFF TOURN SGL QUICK 24 (TOURNIQUET CUFF)
CUFF TOURN SGL QUICK 34 (TOURNIQUET CUFF)
CUFF TRNQT CYL 24X4X16.5-23 (TOURNIQUET CUFF) IMPLANT
CUFF TRNQT CYL 34X4.125X (TOURNIQUET CUFF) IMPLANT
DRAPE C-ARM 42X72 X-RAY (DRAPES) IMPLANT
DRAPE C-ARMOR (DRAPES) ×3 IMPLANT
DRAPE U-SHAPE 47X51 STRL (DRAPES) ×3 IMPLANT
DRSG ADAPTIC 3X8 NADH LF (GAUZE/BANDAGES/DRESSINGS) ×3 IMPLANT
ELECT REM PT RETURN 9FT ADLT (ELECTROSURGICAL) ×3
ELECTRODE REM PT RTRN 9FT ADLT (ELECTROSURGICAL) ×1 IMPLANT
GAUZE SPONGE 4X4 12PLY STRL (GAUZE/BANDAGES/DRESSINGS) ×3 IMPLANT
GAUZE SPONGE 4X4 16PLY XRAY LF (GAUZE/BANDAGES/DRESSINGS) ×2 IMPLANT
GLOVE BIO SURGEON STRL SZ 6.5 (GLOVE) ×6 IMPLANT
GLOVE BIO SURGEON STRL SZ7.5 (GLOVE) ×12 IMPLANT
GLOVE BIO SURGEONS STRL SZ 6.5 (GLOVE) ×3
GLOVE BIOGEL PI IND STRL 6.5 (GLOVE) ×1 IMPLANT
GLOVE BIOGEL PI IND STRL 7.5 (GLOVE) ×1 IMPLANT
GLOVE BIOGEL PI INDICATOR 6.5 (GLOVE) ×2
GLOVE BIOGEL PI INDICATOR 7.5 (GLOVE) ×2
GOWN STRL REUS W/ TWL LRG LVL3 (GOWN DISPOSABLE) ×2 IMPLANT
GOWN STRL REUS W/TWL LRG LVL3 (GOWN DISPOSABLE) ×4
KIT BASIN OR (CUSTOM PROCEDURE TRAY) ×3 IMPLANT
KIT TURNOVER KIT B (KITS) ×3 IMPLANT
MANIFOLD NEPTUNE II (INSTRUMENTS) ×3 IMPLANT
NEEDLE 22X1 1/2 (OR ONLY) (NEEDLE) IMPLANT
NS IRRIG 1000ML POUR BTL (IV SOLUTION) ×3 IMPLANT
PACK ORTHO EXTREMITY (CUSTOM PROCEDURE TRAY) ×3 IMPLANT
PAD ARMBOARD 7.5X6 YLW CONV (MISCELLANEOUS) ×6 IMPLANT
PADDING CAST COTTON 6X4 STRL (CAST SUPPLIES) ×9 IMPLANT
PADDING CAST SYN 6 (CAST SUPPLIES) ×2
PADDING CAST SYNTHETIC 6X4 NS (CAST SUPPLIES) IMPLANT
SPONGE LAP 18X18 RF (DISPOSABLE) ×3 IMPLANT
STAPLER VISISTAT 35W (STAPLE) IMPLANT
STOCKINETTE IMPERVIOUS LG (DRAPES) ×3 IMPLANT
STRIP CLOSURE SKIN 1/2X4 (GAUZE/BANDAGES/DRESSINGS) IMPLANT
SUCTION FRAZIER HANDLE 10FR (MISCELLANEOUS)
SUCTION TUBE FRAZIER 10FR DISP (MISCELLANEOUS) IMPLANT
SUT ETHILON 3 0 PS 1 (SUTURE) IMPLANT
SUT MNCRL AB 3-0 PS2 18 (SUTURE) ×3 IMPLANT
SUT MON AB 2-0 CT1 36 (SUTURE) ×3 IMPLANT
SUT PDS AB 2-0 CT1 27 (SUTURE) IMPLANT
SUT VIC AB 0 CT1 27 (SUTURE)
SUT VIC AB 0 CT1 27XBRD ANBCTR (SUTURE) IMPLANT
SUT VIC AB 2-0 CT1 27 (SUTURE)
SUT VIC AB 2-0 CT1 TAPERPNT 27 (SUTURE) IMPLANT
SYR CONTROL 10ML LL (SYRINGE) IMPLANT
TOWEL GREEN STERILE (TOWEL DISPOSABLE) ×6 IMPLANT
TOWEL GREEN STERILE FF (TOWEL DISPOSABLE) ×6 IMPLANT
TUBE CONNECTING 12'X1/4 (SUCTIONS) ×1
TUBE CONNECTING 12X1/4 (SUCTIONS) ×2 IMPLANT
UNDERPAD 30X30 (UNDERPADS AND DIAPERS) ×3 IMPLANT
WATER STERILE IRR 1000ML POUR (IV SOLUTION) ×6 IMPLANT
YANKAUER SUCT BULB TIP NO VENT (SUCTIONS) ×3 IMPLANT

## 2019-07-29 NOTE — Progress Notes (Signed)
At 1025, pt's HR 170's and SBP 170's. Anesthesiologist notified and ordered ECG. MD en route to bedside. ECG results showed sinus tachycardia with a shortened PR interval. No other abnormalities. Pt occasionally feels as though "heart is racing". At 1100, MD at bedside. Pt's HR 120's and SBP 160's. MD ordered to continue to monitor pt for 30 minutes prior to d/c and ensure pt's HR remains below 150. Will f/u.

## 2019-07-29 NOTE — Transfer of Care (Signed)
Immediate Anesthesia Transfer of Care Note  Patient: Thyra Breed  Procedure(s) Performed: HARDWARE REMOVAL RIGHT TIBIA (Right )  Patient Location: PACU  Anesthesia Type:General  Level of Consciousness: awake, alert  and oriented  Airway & Oxygen Therapy: Patient Spontanous Breathing and Patient connected to nasal cannula oxygen  Post-op Assessment: Report given to RN, Post -op Vital signs reviewed and stable and Patient moving all extremities X 4  Post vital signs: Reviewed and stable  Last Vitals:  Vitals Value Taken Time  BP    Temp    Pulse 109 07/29/19 0907  Resp 23 07/29/19 0907  SpO2 100 % 07/29/19 0907  Vitals shown include unvalidated device data.  Last Pain:  Vitals:   07/29/19 0616  TempSrc:   PainSc: 0-No pain         Complications: No apparent anesthesia complications

## 2019-07-29 NOTE — Anesthesia Procedure Notes (Signed)
Procedure Name: LMA Insertion Date/Time: 07/29/2019 7:34 AM Performed by: Neldon Newport, CRNA Pre-anesthesia Checklist: Timeout performed, Patient being monitored, Suction available, Emergency Drugs available and Patient identified Patient Re-evaluated:Patient Re-evaluated prior to induction Oxygen Delivery Method: Circle system utilized Preoxygenation: Pre-oxygenation with 100% oxygen Induction Type: IV induction Ventilation: Mask ventilation without difficulty LMA: LMA inserted LMA Size: 4.0 Number of attempts: 1 Placement Confirmation: breath sounds checked- equal and bilateral,  positive ETCO2 and ETT inserted through vocal cords under direct vision Tube secured with: Tape Dental Injury: Teeth and Oropharynx as per pre-operative assessment

## 2019-07-29 NOTE — Anesthesia Preprocedure Evaluation (Addendum)
Anesthesia Evaluation  Patient identified by MRN, date of birth, ID band Patient awake    Reviewed: Allergy & Precautions, NPO status , Patient's Chart, lab work & pertinent test results  Airway Mallampati: III  TM Distance: >3 FB Neck ROM: full  Mouth opening: Limited Mouth Opening  Dental  (+) Teeth Intact, Dental Advidsory Given   Pulmonary    breath sounds clear to auscultation       Cardiovascular  Rhythm:regular Rate:Normal     Neuro/Psych    GI/Hepatic   Endo/Other    Renal/GU      Musculoskeletal   Abdominal (+) + obese,   Peds  Hematology   Anesthesia Other Findings Spoke with patient with the use of an interpretor.   Reproductive/Obstetrics                            Anesthesia Physical Anesthesia Plan  ASA: II  Anesthesia Plan: General   Post-op Pain Management:    Induction: Intravenous  PONV Risk Score and Plan: Ondansetron and Dexamethasone  Airway Management Planned: LMA  Additional Equipment:   Intra-op Plan:   Post-operative Plan:   Informed Consent: I have reviewed the patients History and Physical, chart, labs and discussed the procedure including the risks, benefits and alternatives for the proposed anesthesia with the patient or authorized representative who has indicated his/her understanding and acceptance.     Dental Advisory Given  Plan Discussed with:   Anesthesia Plan Comments:        Anesthesia Quick Evaluation

## 2019-07-29 NOTE — Progress Notes (Signed)
Orthopedic Tech Progress Note Patient Details:  Christopher Garrett 04-Aug-1986 [id number]  Ortho Devices Type of Ortho Device: Crutches Ortho Device/Splint Interventions: Application   Post Interventions Patient Tolerated: Well Instructions Provided: Care of device   [name] 07/29/2019, 12:23 PM

## 2019-07-29 NOTE — Anesthesia Postprocedure Evaluation (Signed)
Anesthesia Post Note  Patient: Christopher Garrett  Procedure(s) Performed: HARDWARE REMOVAL RIGHT TIBIA (Right )     Patient location during evaluation: PACU Anesthesia Type: General Level of consciousness: awake and alert Pain management: pain level controlled Vital Signs Assessment: post-procedure vital signs reviewed and stable Respiratory status: spontaneous breathing, nonlabored ventilation, respiratory function stable and patient connected to nasal cannula oxygen Cardiovascular status: blood pressure returned to baseline and stable Postop Assessment: no apparent nausea or vomiting Anesthetic complications: no    Last Vitals:  Vitals:   07/29/19 1000 07/29/19 1002  BP:  (!) 142/95  Pulse: 95 96  Resp: 13 13  Temp:  36.5 C  SpO2: 98% 98%    Last Pain:  Vitals:   07/29/19 1012  TempSrc:   PainSc: 4     LLE Motor Response: Purposeful movement;Responds to commands (07/29/19 1012) LLE Sensation: Full sensation (07/29/19 1012) RLE Motor Response: Purposeful movement;Responds to commands (07/29/19 1012) RLE Sensation: Full sensation (07/29/19 1012)      Danyele Smejkal COKER

## 2019-07-29 NOTE — Discharge Instructions (Signed)
[facility] Discharge Instructions   General Discharge Instructions  WEIGHT BEARING STATUS: weightbearing as tolerated right leg  RANGE OF MOTION/ACTIVITY: unrestricted range of motion of right knee  Wound Care: You may remove your operative dressing on post-op day #2 (Sunday 07/31/19). Incisions can be left open to air if there is no drainage. Leave steri-strips in place, they will fall off on their own.  Okay to shower and get steri-strips wet.   DVT/PE prophylaxis: None  Diet: as you were eating previously.  Can use over the counter stool softeners and bowel preparations, such as Miralax, to help with bowel movements.  Narcotics can be constipating.  Be sure to drink plenty of fluids  PAIN MEDICATION USE AND EXPECTATIONS  You have likely been given narcotic medications to help control your pain.  After a traumatic event that results in an fracture (broken bone) with or without surgery, it is ok to use narcotic pain medications to help control one's pain.  We understand that everyone responds to pain differently and each individual patient will be evaluated on a regular basis for the continued need for narcotic medications. Ideally, narcotic medication use should last no more than 6-8 weeks (coinciding with fracture healing).   As a patient it is your responsibility as well to monitor narcotic medication use and report the amount and frequency you use these medications when you come to your office visit.   We would also advise that if you are using narcotic medications, you should take a dose prior to therapy to maximize you participation.  IF YOU ARE ON NARCOTIC MEDICATIONS IT IS NOT PERMISSIBLE TO OPERATE A MOTOR VEHICLE (MOTORCYCLE/CAR/TRUCK/MOPED) OR HEAVY MACHINERY DO NOT MIX NARCOTICS WITH OTHER CNS (CENTRAL NERVOUS SYSTEM) DEPRESSANTS SUCH AS ALCOHOL   STOP SMOKING OR USING NICOTINE PRODUCTS!!!!  As discussed nicotine severely impairs your body's ability to heal  surgical and traumatic wounds but also impairs bone healing.  Wounds and bone heal by forming microscopic blood vessels (angiogenesis) and nicotine is a vasoconstrictor (essentially, shrinks blood vessels).  Therefore, if vasoconstriction occurs to these microscopic blood vessels they essentially disappear and are unable to deliver necessary nutrients to the healing tissue.  This is one modifiable factor that you can do to dramatically increase your chances of healing your injury.    (This means no smoking, no nicotine gum, patches, etc)  DO NOT USE NONSTEROIDAL ANTI-INFLAMMATORY DRUGS (NSAID'S)  Using products such as Advil (ibuprofen), Aleve (naproxen), Motrin (ibuprofen) for additional pain control during fracture healing can delay and/or prevent the healing response.  If you would like to take over the counter (OTC) medication, Tylenol (acetaminophen) is ok.  However, some narcotic medications that are given for pain control contain acetaminophen as well. Therefore, you should not exceed more than 4000 mg of tylenol in a day if you do not have liver disease.  Also note that there are may OTC medicines, such as cold medicines and allergy medicines that my contain tylenol as well.  If you have any questions about medications and/or interactions please ask your doctor/PA or your pharmacist.      ICE AND ELEVATE INJURED/OPERATIVE EXTREMITY  Using ice and elevating the injured extremity above your heart can help with swelling and pain control.  Icing in a pulsatile fashion, such as 20 minutes on and 20 minutes off, can be followed.    Do not place ice directly on skin. Make sure there is a barrier between to skin and the ice pack.  Using frozen items such as frozen peas works well as the conform nicely to the are that needs to be iced.  USE AN ACE WRAP OR TED HOSE FOR SWELLING CONTROL  In addition to icing and elevation, Ace wraps or TED hose are used to help limit and resolve swelling.  It is  recommended to use Ace wraps or TED hose until you are informed to stop.    When using Ace Wraps start the wrapping distally (farthest away from the body) and wrap proximally (closer to the body)   Example: If you had surgery on your leg or thing and you do not have a splint on, start the ace wrap at the toes and work your way up to the thigh        If you had surgery on your upper extremity and do not have a splint on, start the ace wrap at your fingers and work your way up to the upper arm   [employer]: [phone]   VISIT OUR WEBSITE FOR ADDITIONAL INFORMATION: [email or web address]      Discharge Wound Care Instructions  Do NOT apply any ointments, solutions or lotions to pin sites or surgical wounds.  These prevent needed drainage and even though solutions like hydrogen peroxide kill bacteria, they also damage cells lining the pin sites that help fight infection.  Applying lotions or ointments can keep the wounds moist and can cause them to breakdown and open up as well. This can increase the risk for infection. When in doubt call the office.  Surgical incisions should be dressed daily.  If any drainage is noted, use one layer of adaptic, then gauze, Kerlix, and an ace wrap.  Once the incision is completely dry and without drainage, it may be left open to air out.  Showering may begin 36-48 hours later.  Cleaning gently with soap and water.  Traumatic wounds should be dressed daily as well.    One layer of adaptic, gauze, Kerlix, then ace wrap.  The adaptic can be discontinued once the draining has ceased    If you have a wet to dry dressing: wet the gauze with saline the squeeze as much saline out so the gauze is moist (not soaking wet), place moistened gauze over wound, then place a dry gauze over the moist one, followed by Kerlix wrap, then ace wrap.

## 2019-07-29 NOTE — Interval H&P Note (Signed)
History and Physical Interval Note:  07/29/2019 7:03 AM  Christopher Garrett  has presented today for surgery, with the diagnosis of Right painful tibia hardware.  The various methods of treatment have been discussed with the patient and family. After consideration of risks, benefits and other options for treatment, the patient has consented to  Procedure(s): HARDWARE REMOVAL RIGHT TIBIA (Right) as a surgical intervention.  The patient's history has been reviewed, patient examined, no change in status, stable for surgery.  I have reviewed the patient's chart and labs.  Questions were answered to the patient's satisfaction.     Lennette Bihari P Kam Kushnir

## 2019-07-29 NOTE — Op Note (Signed)
Orthopaedic Surgery Operative Note (CSN: [id number] ) Date of Surgery: 07/29/2019  Admit Date: 07/29/2019   Diagnoses: Pre-Op Diagnoses: Healed segmental right tibial shaft fracture Painful orthopaedic hardware   Post-Op Diagnosis: Same  Procedures: CPT 20680-Removal of hardware right tibia  Surgeons : Primary: Shona Needles, MD  Assistant: Patrecia Pace, PA-C  Location: OR 5   Anesthesia:General  Antibiotics: Ancef 2g preop   Tourniquet time:None  Estimated Blood WUJW:11 mL  Complications:None   Specimens:None   Implants: None  Indications for Surgery: 33 year old male sustained a right segmental tibial shaft fracture. Underwent intramedullary nailing in October 2019. Subsequently healed the fracture but still had pain, especially around his interlocking screws. I recommended removal of hardware. Risks and benefits were discussed and he agreed to proceed with surgery.  Operative Findings: Removal of right tibial nail without complication  Procedure: The patient was identified in the preoperative holding area. Consent was confirmed with the patient and their family and all questions were answered. The operative extremity was marked after confirmation with the patient. he was then brought back to the operating room by our anesthesia colleagues. He was carefully transferred over to a radiolucent flat top table. He was placed under general anesthesia. The operative extremity was then prepped and draped in usual sterile fashion. A preoperative timeout was performed to verify the patient, the procedure, and the extremity. Preoperative antibiotics were dosed.  I first removed the proximal two interlocking screws through percutaneous incisions. I then made an incision through the previous lateral parapatellar incision and placed the threaded extraction bolt to the top of the nail. Once the extraction bolt was threaded well into the nail, I removed the distal two interlocks and  last proximal interlock through percutaneous incisions. The nail was then removed without difficulty.  Final fluoroscopic images were obtained. The incisions were irrigated. A gram of vancomycin powder was placed into the incisions. The incisions were closed with 2-0 vicryl and 3-0 monocryl. Steri-strips were placed. Sterile dressing of 4x4s, sterile cast padding and ACE wrap was placed. The patient was awoken from anesthesia and taken to the PACU in stable condition.  Post Op Plan/Instructions: Patient will be WBAT RLE. No DVT prophylaxis. Follow up in 2 weeks for wound check.  I was present and performed the entire surgery.  Patrecia Pace, PA-C did assist me throughout the case. An assistant was necessary given the difficulty in approach, maintenance of reduction and ability to instrument the fracture.   Katha Hamming, MD [facility]

## 2019-08-01 ENCOUNTER — Encounter (HOSPITAL_COMMUNITY): Payer: Self-pay | Admitting: Student

## 2019-08-30 ENCOUNTER — Other Ambulatory Visit: Payer: Self-pay

## 2019-08-30 ENCOUNTER — Emergency Department
Admission: EM | Admit: 2019-08-30 | Discharge: 2019-08-31 | Disposition: A | Discharge status: Home / Self Care | Payer: Self-pay | Attending: Emergency Medicine | Admitting: Emergency Medicine

## 2019-08-30 ENCOUNTER — Encounter: Payer: Self-pay | Admitting: Emergency Medicine

## 2019-08-30 ENCOUNTER — Emergency Department: Payer: Self-pay

## 2019-08-30 DIAGNOSIS — R002 Palpitations: Secondary | ICD-10-CM | POA: Insufficient documentation

## 2019-08-30 DIAGNOSIS — R42 Dizziness and giddiness: Secondary | ICD-10-CM | POA: Insufficient documentation

## 2019-08-30 LAB — COMPREHENSIVE METABOLIC PANEL
ALT: 50 U/L — ABNORMAL HIGH (ref 0–44)
AST: 36 U/L (ref 15–41)
Albumin: 4.7 g/dL (ref 3.5–5.0)
Alkaline Phosphatase: 72 U/L (ref 38–126)
Anion gap: 10 (ref 5–15)
BUN: 9 mg/dL (ref 6–20)
CO2: 22 mmol/L (ref 22–32)
Calcium: 9.1 mg/dL (ref 8.9–10.3)
Chloride: 109 mmol/L (ref 98–111)
Creatinine, Ser: 0.9 mg/dL (ref 0.61–1.24)
GFR calc Af Amer: >60 mL/min (ref >60)
GFR calc non Af Amer: >60 mL/min (ref >60)
Glucose, Bld: 125 mg/dL — ABNORMAL HIGH (ref 70–99)
Potassium: 3.3 mmol/L — ABNORMAL LOW (ref 3.5–5.1)
Sodium: 141 mmol/L (ref 135–145)
Total Bilirubin: 0.6 mg/dL (ref 0.3–1.2)
Total Protein: 7.7 g/dL (ref 6.5–8.1)

## 2019-08-30 LAB — CBC WITH DIFFERENTIAL/PLATELET
Abs Immature Granulocytes: 0.05 10*3/uL (ref 0.00–0.07)
Basophils Absolute: 0.1 10*3/uL (ref 0.0–0.1)
Basophils Relative: 1 %
Eosinophils Absolute: 0.2 10*3/uL (ref 0.0–0.5)
Eosinophils Relative: 2 %
HCT: 47.2 % (ref 39.0–52.0)
Hemoglobin: 15.1 g/dL (ref 13.0–17.0)
Immature Granulocytes: 1 %
Lymphocytes Relative: 33 %
Lymphs Abs: 3.3 10*3/uL (ref 0.7–4.0)
MCH: 27.6 pg (ref 26.0–34.0)
MCHC: 32 g/dL (ref 30.0–36.0)
MCV: 86.1 fL (ref 80.0–100.0)
Monocytes Absolute: 0.9 10*3/uL (ref 0.1–1.0)
Monocytes Relative: 9 %
Neutro Abs: 5.5 10*3/uL (ref 1.7–7.7)
Neutrophils Relative %: 54 %
Platelets: 285 10*3/uL (ref 150–400)
RBC: 5.48 MIL/uL (ref 4.22–5.81)
RDW: 12.2 % (ref 11.5–15.5)
WBC: 10 10*3/uL (ref 4.0–10.5)
nRBC: 0 % (ref 0.0–0.2)

## 2019-08-30 LAB — TROPONIN I (HIGH SENSITIVITY): Troponin I (High Sensitivity): 3 ng/L (ref <18)

## 2019-08-30 NOTE — ED Triage Notes (Signed)
Patient ambulatory to triage with steady gait, without difficulty or distress noted, mask in place; pt reports onset dizziness and sensation of heart pounding fast; st similar episode with his knee surgery but was told everything was OK; pt denies pain or any other accomp symptoms

## 2019-08-30 NOTE — ED Notes (Signed)
Patient updated on wait time 

## 2019-08-30 NOTE — ED Notes (Addendum)
Patient updated on wait time 

## 2019-08-30 NOTE — ED Notes (Signed)
Lab results reviewed

## 2019-08-31 LAB — URINE DRUG SCREEN, QUALITATIVE (ARMC ONLY)
Amphetamines, Ur Screen: NOT DETECTED
Barbiturates, Ur Screen: NOT DETECTED
Benzodiazepine, Ur Scrn: NOT DETECTED
Cannabinoid 50 Ng, Ur ~~LOC~~: NOT DETECTED
Cocaine Metabolite,Ur ~~LOC~~: NOT DETECTED
MDMA (Ecstasy)Ur Screen: NOT DETECTED
Methadone Scn, Ur: NOT DETECTED
Opiate, Ur Screen: NOT DETECTED
Phencyclidine (PCP) Ur S: NOT DETECTED
Tricyclic, Ur Screen: NOT DETECTED

## 2019-08-31 LAB — URINALYSIS, COMPLETE (UACMP) WITH MICROSCOPIC
Bacteria, UA: NONE SEEN
Bilirubin Urine: NEGATIVE
Glucose, UA: NEGATIVE mg/dL
Hgb urine dipstick: NEGATIVE
Ketones, ur: NEGATIVE mg/dL
Leukocytes,Ua: NEGATIVE
Nitrite: NEGATIVE
Protein, ur: NEGATIVE mg/dL
Specific Gravity, Urine: 1.024 (ref 1.005–1.030)
pH: 6 (ref 5.0–8.0)

## 2019-08-31 LAB — TSH: TSH: 1.614 u[IU]/mL (ref 0.350–4.500)

## 2019-08-31 LAB — T4, FREE: Free T4: 0.92 ng/dL (ref 0.61–1.12)

## 2019-08-31 LAB — TROPONIN I (HIGH SENSITIVITY): Troponin I (High Sensitivity): 5 ng/L (ref <18)

## 2019-08-31 NOTE — ED Provider Notes (Signed)
[facility] Emergency Department Provider Note  ____________________________________________  Time seen: Approximately 12:27 AM  I have reviewed the triage vital signs and the nursing notes.   HISTORY  Chief Complaint Dizziness   HPI Christopher Garrett is a 33 y.o. male no significant past medical history who presents for evaluation of dizziness and palpitations.   Patient reports that he was sitting on the couch watching TV when he started feeling lightheaded and felt that his heart was racing.  Patient reports a similar episode when he had knee surgery back in August and at that time he was told by the anesthesiologist that everything looked okay.  Patient had a tibial fracture that happened at work in October 2019.  Since then he has been off work.  He reports feeling somewhat anxious.  He reports having similar episodes where he feels that his heart racing.  He reports that these happen usually when he is alone and his mind is racing.  He denies any prior history of anxiety, depression.  He denies drugs and alcohol use, drinks one cup of coffee a week, no other significant caffeine intake.  He denies any suicidal homicidal intent.  He reports having 3 episodes in the last few months where he felt pretty anxious, dizzy, and had some trouble breathing.  He denies any chest pain or shortness of breath, fever or chills.  He reports feeling back to normal at this time.  He denies any personal or family history of heart problems, blood clots.  Past Medical History:  Diagnosis Date  . Complication of anesthesia    slow to wake up  . Medical history non-contributory     Patient Active Problem List   Diagnosis Date Noted  . Closed fracture of right fibula and tibia 07/06/2019  . Painful orthopaedic hardware (HCC) 07/06/2019  . Closed displaced segmental fracture of shaft of right tibia 10/04/2018    Past Surgical History:  Procedure Laterality Date  .  HARDWARE REMOVAL Right 07/29/2019   Procedure: HARDWARE REMOVAL RIGHT TIBIA;  Surgeon: Roby Lofts, MD;  Location: [facility] OR;  Service: Orthopedics;  Laterality: Right;  . TIBIA IM NAIL INSERTION Right 10/04/2018   Procedure: INTRAMEDULLARY (IM) NAIL TIBIAL;  Surgeon: Roby Lofts, MD;  Location: [facility] OR;  Service: Orthopedics;  Laterality: Right;    Prior to Admission medications   Medication Sig Start Date End Date Taking? Authorizing Provider  acetaminophen (TYLENOL) 325 MG tablet Take 650 mg by mouth every 6 (six) hours as needed for moderate pain or headache.     [provider]  oxyCODONE (ROXICODONE) 5 MG immediate release tablet Take 1 tablet (5 mg total) by mouth every 4 (four) hours as needed for severe pain. 07/29/19   Despina Hidden, PA-C    Allergies Patient has no known allergies.  No family history on file.  Social History Social History   Tobacco Use  . Smoking status: Never Smoker  . Smokeless tobacco: Never Used  Substance Use Topics  . Alcohol use: Never    Frequency: Never  . Drug use: Never    Review of Systems  Constitutional: Negative for fever. + Lightheadedness Eyes: Negative for visual changes. ENT: Negative for sore throat. Neck: No neck pain  Cardiovascular: Negative for chest pain. + palpitations Respiratory: Negative for shortness of breath. Gastrointestinal: Negative for abdominal pain, vomiting or diarrhea. Genitourinary: Negative for dysuria. Musculoskeletal: Negative for back pain. Skin: Negative for rash. Neurological: Negative for headaches, weakness or  numbness. Psych: No SI or HI. + anxiety  ____________________________________________   PHYSICAL EXAM:  VITAL SIGNS: ED Triage Vitals  Enc Vitals Group     BP 08/30/19 1923 (!) 152/100     Pulse Rate 08/30/19 1923 100     Resp 08/30/19 1923 18     Temp 08/30/19 1923 98.2 F (36.8 C)     Temp Source 08/30/19 1923 Oral     SpO2 08/30/19 1923 98 %     Weight 08/30/19  1921 230 lb (104.3 kg)     Height 08/30/19 1921 5\' 7"  (1.702 m)     Head Circumference --      Peak Flow --      Pain Score 08/30/19 1921 0     Pain Loc --      Pain Edu? --      Excl. in GC? --     Constitutional: Alert and oriented. Well appearing and in no apparent distress. HEENT:      Head: Normocephalic and atraumatic.         Eyes: Conjunctivae are normal. Sclera is non-icteric.       Mouth/Throat: Mucous membranes are moist.       Neck: Supple with no signs of meningismus. Cardiovascular: Regular rate and rhythm. No murmurs, gallops, or rubs. 2+ symmetrical distal pulses are present in all extremities. No JVD. Respiratory: Normal respiratory effort. Lungs are clear to auscultation bilaterally. No wheezes, crackles, or rhonchi.  Gastrointestinal: Soft, non tender, and non distended with positive bowel sounds. No rebound or guarding. Musculoskeletal: Nontender with normal range of motion in all extremities. No edema, cyanosis, or erythema of extremities. Neurologic: Normal speech and language. Face is symmetric. Moving all extremities. No gross focal neurologic deficits are appreciated. Skin: Skin is warm, dry and intact. No rash noted. Psychiatric: Mood and affect are normal. Speech and behavior are normal.  ____________________________________________   LABS (all labs ordered are listed, but only abnormal results are displayed)  Labs Reviewed  COMPREHENSIVE METABOLIC PANEL - Abnormal; Notable for the following components:      Result Value   Potassium 3.3 (*)    Glucose, Bld 125 (*)    ALT 50 (*)    All other components within normal limits  URINALYSIS, COMPLETE (UACMP) WITH MICROSCOPIC - Abnormal; Notable for the following components:   Color, Urine YELLOW (*)    APPearance CLEAR (*)    All other components within normal limits  CBC WITH DIFFERENTIAL/PLATELET  URINE DRUG SCREEN, QUALITATIVE ([facility] ONLY)  TSH  T4, FREE  TROPONIN I (HIGH SENSITIVITY)  TROPONIN I  (HIGH SENSITIVITY)   ____________________________________________  EKG  ED ECG REPORT I, Nita Sicklearolina Selam Pietsch, the attending physician, personally viewed and interpreted this ECG.  Normal sinus rhythm, rate of 95, normal intervals, normal axis, no ST elevations or depressions, diffuse T wave flattening.  Unchanged from prior. ____________________________________________  RADIOLOGY  I have personally reviewed the images performed during this visit and I agree with the Radiologist's read.   Interpretation by Radiologist:  Dg Chest Portable 1 View  Result Date: 08/31/2019 CLINICAL DATA:  Palpitations EXAM: PORTABLE CHEST 1 VIEW COMPARISON:  None. FINDINGS: Streaky basilar atelectatic changes with central vascular crowding. No consolidation, features of edema, pneumothorax, or effusion. Pulmonary vascularity is normally distributed. The cardiomediastinal contours are unremarkable for portable technique. No acute osseous or soft tissue abnormality. IMPRESSION: Atelectasis, otherwise no acute cardiopulmonary abnormality Electronically Signed   By: Kreg ShropshirePrice  DeHay M.D.   On: 08/31/2019 00:24  ____________________________________________   PROCEDURES  Procedure(s) performed: None Procedures Critical Care performed:  None ____________________________________________   INITIAL IMPRESSION / ASSESSMENT AND PLAN / ED COURSE  33 y.o. male no significant past medical history who presents for evaluation of dizziness and palpitations.  Ddx dysrhythmias versus anxiety versus thyroid dysfunction versus dehydration versus electrolyte abnormalities  Patient is well-appearing in no distress with normal vital signs, exam is reassuring with no acute findings.  EKG showing no evidence of dysrhythmias or ischemia.  UA negative for UTI or ketones, no anemia, no significant electrolyte abnormalities, glucose slightly elevated at 125.  This is not a fasting sugar.  Recommended follow-up with PCP for recheck  after fasting.  Drug screen is pending, thyroid studies are pending, repeat troponin is pending.  Will monitor patient on telemetry for any signs of dysrhythmias.  If patient's evaluation is otherwise within normal limits, plan to refer patient to cardiology for Holter monitoring.  Clinical Course as of Aug 30 144  Wed Aug 31, 2019  0142 Work-up essentially unremarkable other than slightly elevated glucose in a nonfasting state.  Patient monitor on telemetry for couple of hours with no signs of dysrhythmias.  Will refer to cardiology for Holter monitoring.  Will refer to PCP for fasting glucose.  Discussed my standard return precautions and follow-up with patient.   [CV]    Clinical Course User Index [CV] Alfred Levins [place], MD      As part of my medical decision making, I reviewed the following data within the [place] notes reviewed and incorporated, Labs reviewed , EKG interpreted , Old EKG reviewed, Old chart reviewed, Radiograph reviewed , Notes from prior ED visits and [place] Controlled Substance Database   Patient was evaluated in Emergency Department today for the symptoms described in the history of present illness. Patient was evaluated in the context of the global COVID-19 pandemic, which necessitated consideration that the patient might be at risk for infection with the SARS-CoV-2 virus that causes COVID-19. Institutional protocols and algorithms that pertain to the evaluation of patients at risk for COVID-19 are in a state of rapid change based on information released by regulatory bodies including the CDC and federal and state organizations. These policies and algorithms were followed during the patient's care in the ED.   ____________________________________________   FINAL CLINICAL IMPRESSION(S) / ED DIAGNOSES   Final diagnoses:  Dizziness  Palpitations      NEW MEDICATIONS STARTED DURING THIS VISIT:  ED Discharge Orders    None        Note:  This document was prepared using Dragon voice recognition software and may include unintentional dictation errors.    Alfred Levins, [place], MD 08/31/19 [phone]

## 2019-08-31 NOTE — Discharge Instructions (Addendum)
Haga un seguimiento con cardiologa para un mayor control de su corazn. Haga un seguimiento con el mdico de atencin primaria para determinar la glucemia en ayunas. Regrese a la sala de emergencias por dolor en el pecho, palpitaciones, sensacin de que va a emitir un sonido, falta de aire o cualquier otro sntoma que le preocupe.   Follow-up with cardiology for further monitoring of your heart.  Follow-up with the primary care doctor for fasting blood glucose.  Return to the emergency room for chest pain, palpitations, feeling like you are going to pass sound, shortness of breath, or any other symptoms concerning to you.

## 2019-10-28 IMAGING — DX PORTABLE RIGHT TIBIA AND FIBULA - 2 VIEW
1 series · 3 of 3 positions shown · non-contrast
Comparison: October 04, 2018

CLINICAL DATA: Hardware removal

EXAM:
PORTABLE RIGHT TIBIA AND FIBULA - 2 VIEW

[Series 1: leg · 0.14mm/px · 3 of 3 slices shown]
[im 1/3]
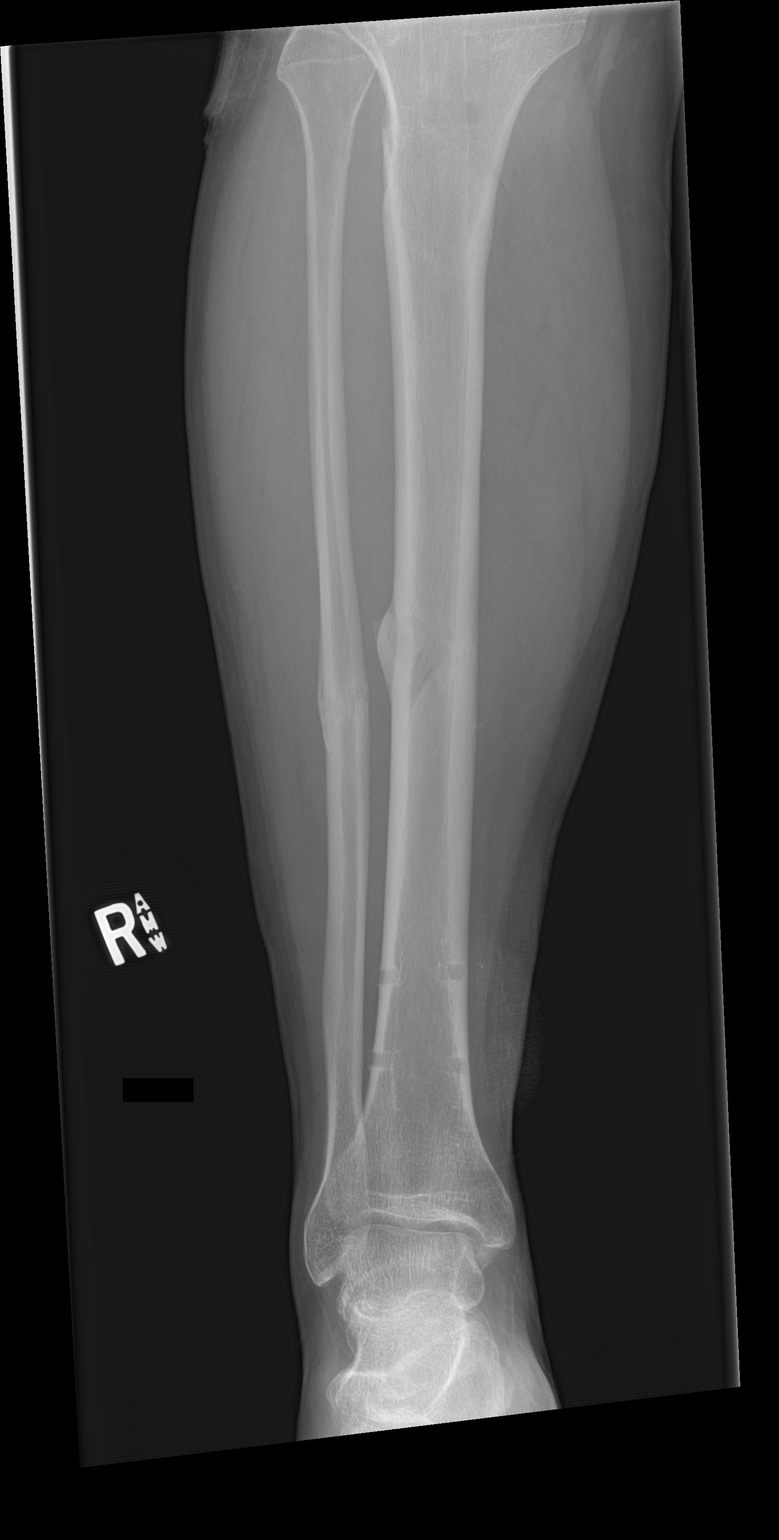
[im 2/3]
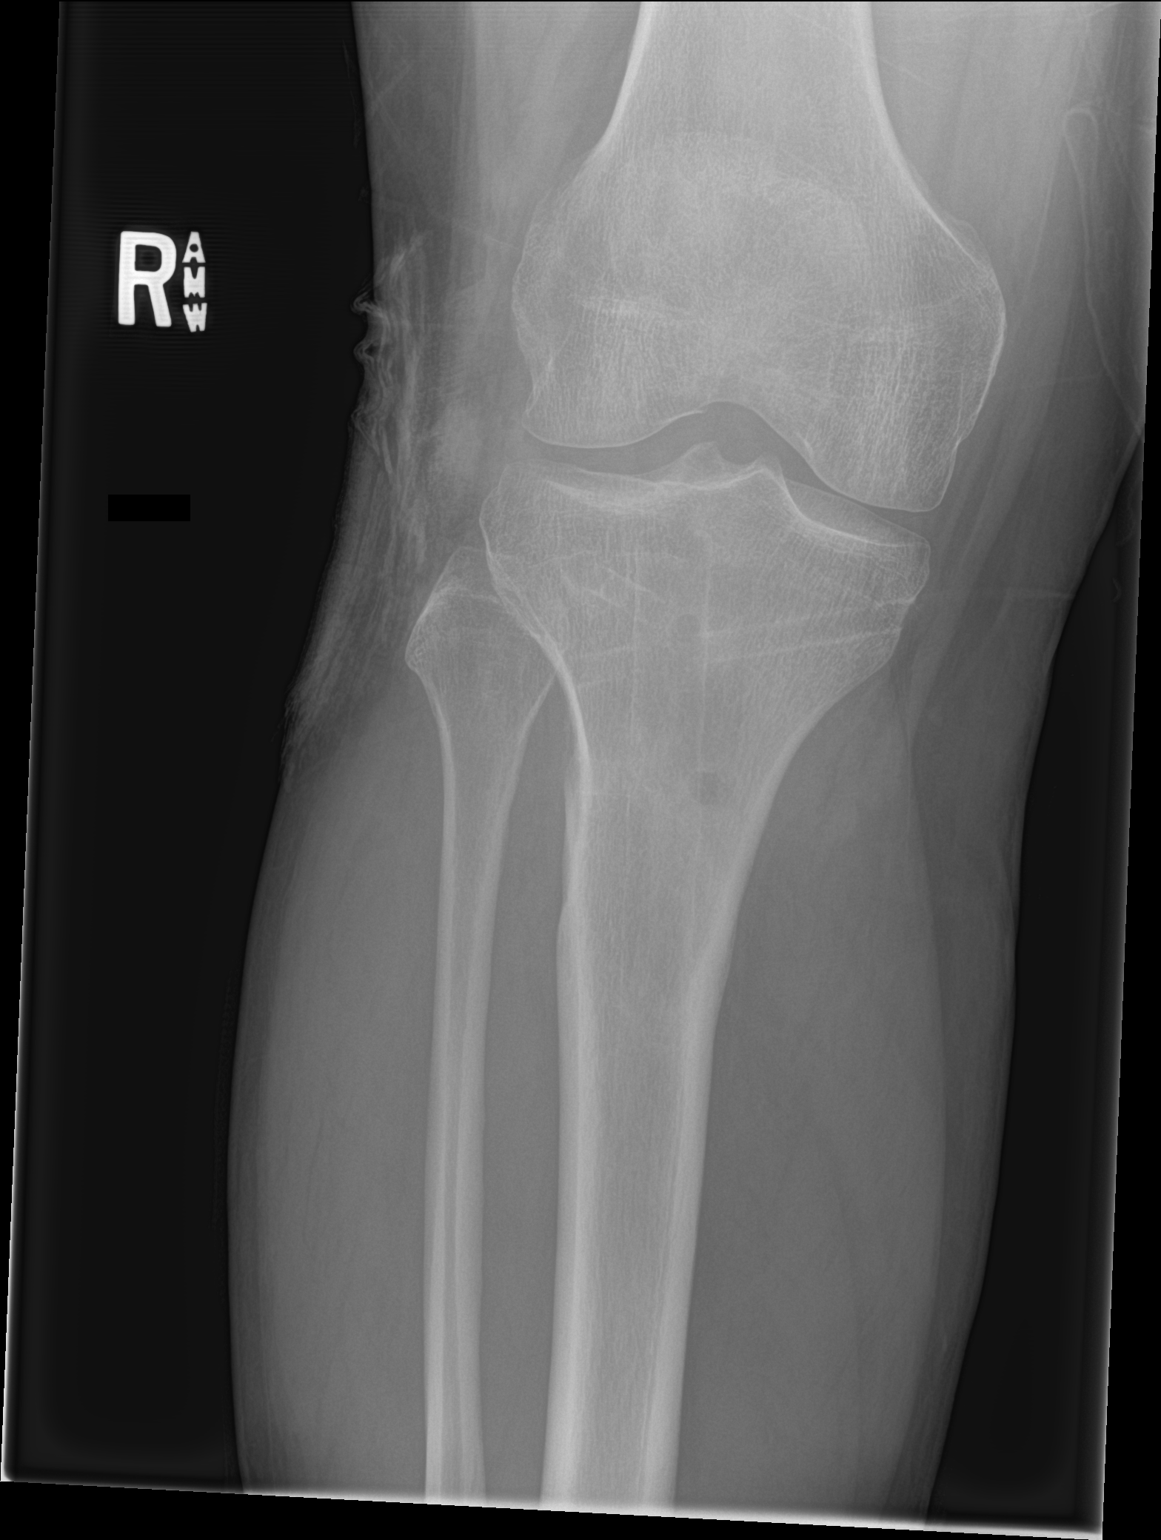
[im 3/3]
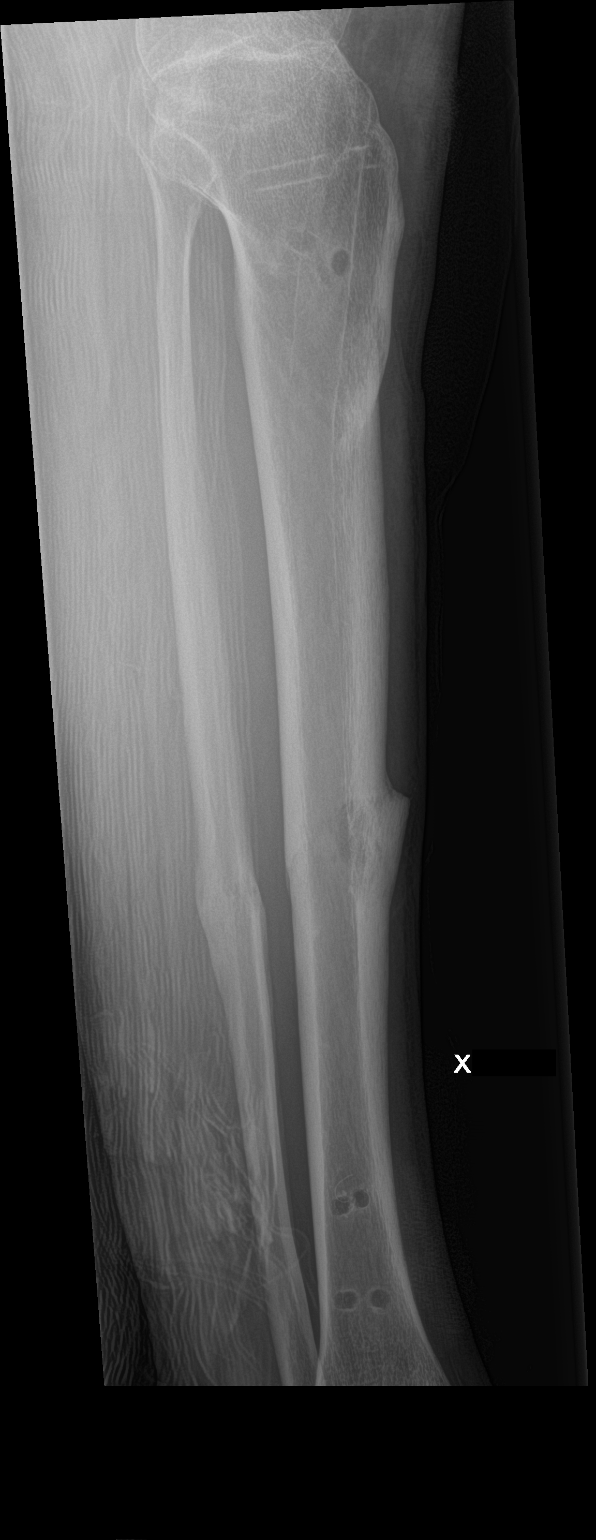

[3 of 3 positions shown; findings below may reference images not displayed]

FINDINGS: The tibial rod and screws have been removed. Healed fractures are
noted. No retained hardware or foreign bodies. No acute fractures
identified. A healed mid fibular fracture is noted as well.
IMPRESSION: Removal of tibial hardware as above.  Healed fractures.

## 2019-10-28 IMAGING — RF RIGHT TIBIA AND FIBULA - 2 VIEW
1 series · 4 of 4 positions shown · non-contrast
Comparison: Radiographs October 04, 2018.

CLINICAL DATA: Hardware removal from right tibia.

EXAM:
RIGHT TIBIA AND FIBULA - 2 VIEW; DG C-ARM 61-120 MIN
FLUOROSCOPY TIME:  1 minutes 40 seconds.

[Series 1: run · 4 of 4 slices shown]
[im 1/4]
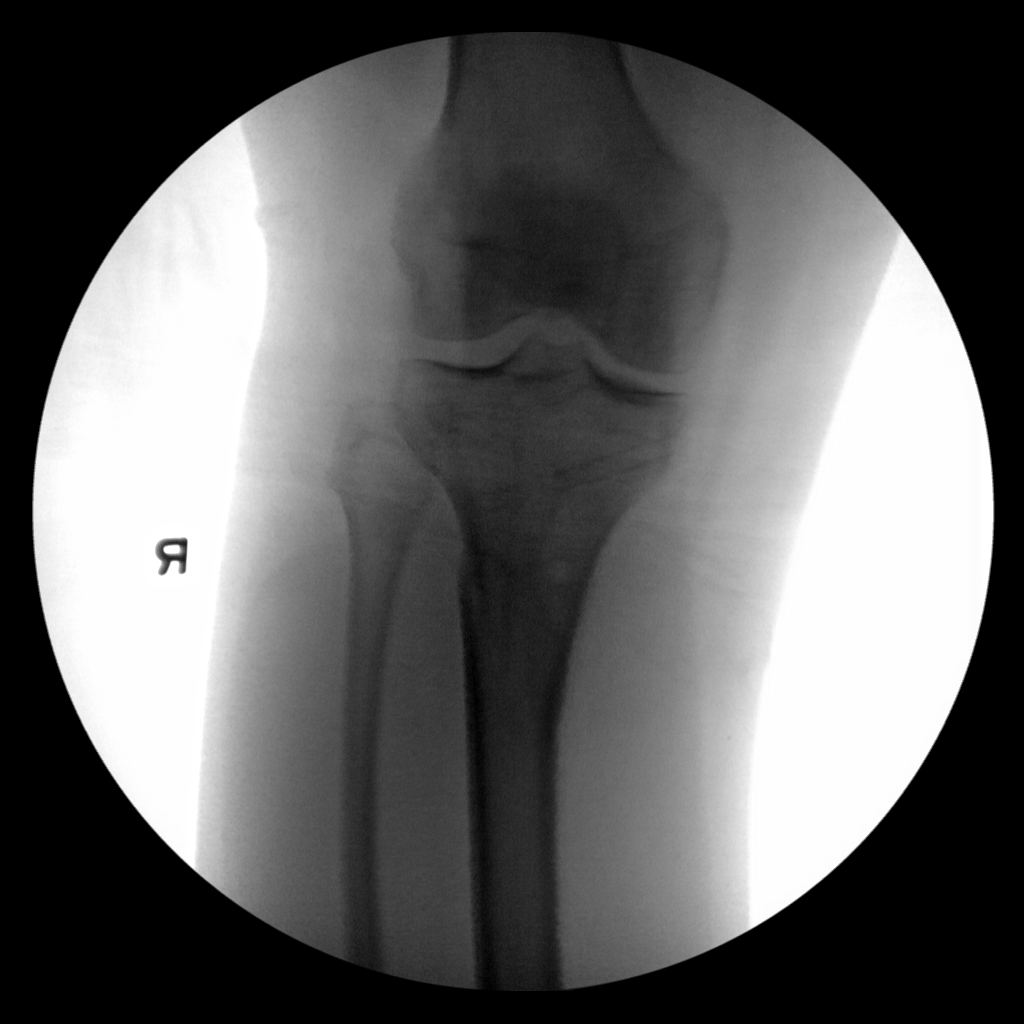
[im 2/4]
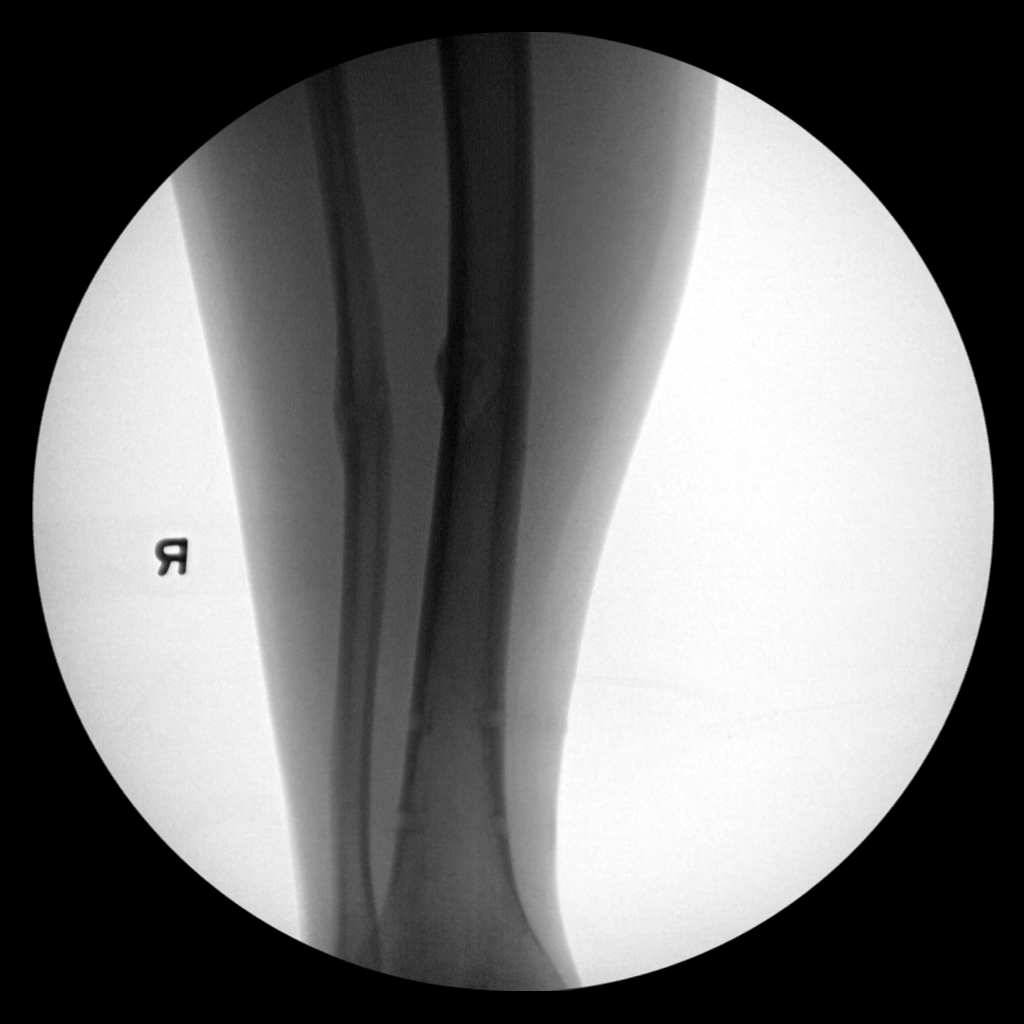
[im 3/4]
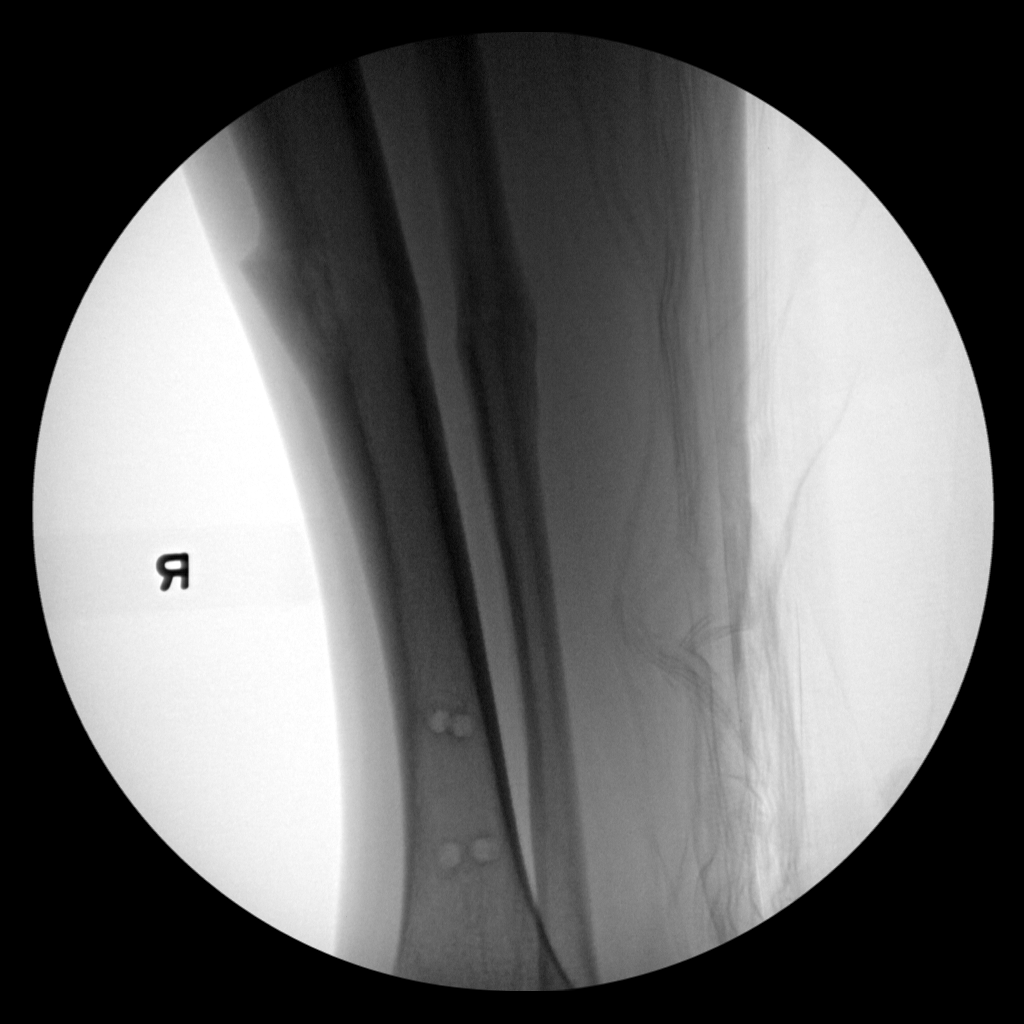
[im 4/4]
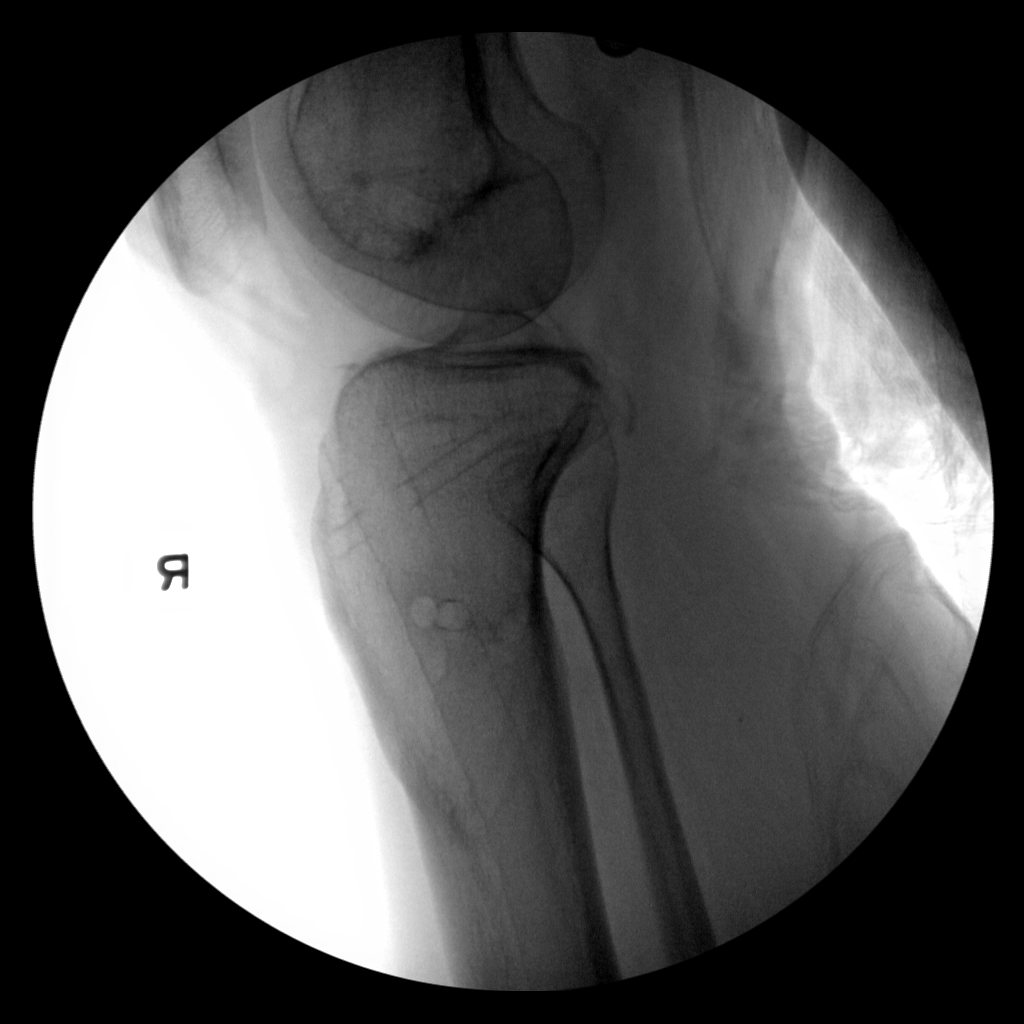

[4 of 4 positions shown; findings below may reference images not displayed]

FINDINGS: Four intraoperative fluoroscopic images of the right tibia and
fibula demonstrate intramedullary rod noted on prior exam to have
been surgically removed. Old healed fractures of the right tibia and
fibula are again noted.
IMPRESSION: Fluoroscopic guidance provided during surgical removal of right
tibial intramedullary rod.

## 2019-11-29 IMAGING — DX DG CHEST 1V PORT
1 series · 1 of 1 positions shown · non-contrast
Comparison: None.

CLINICAL DATA: Palpitations

EXAM:
PORTABLE CHEST 1 VIEW

[chest ap]
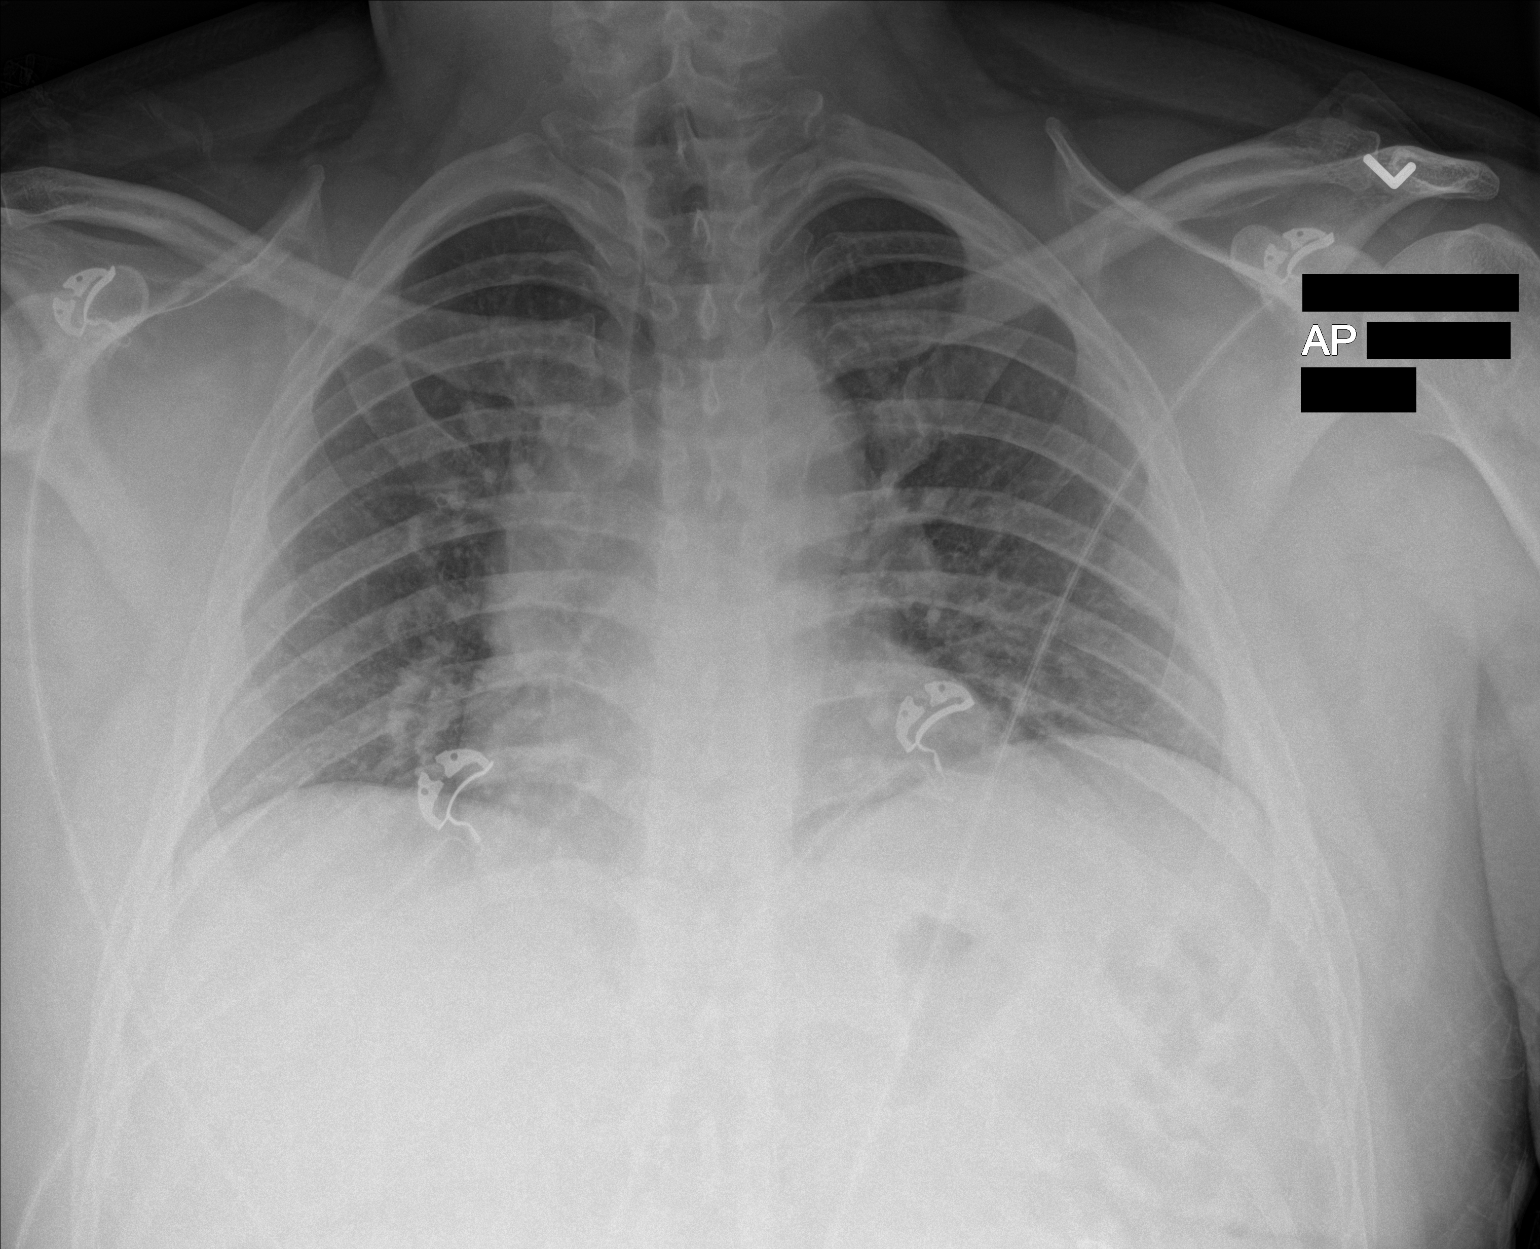

[1 of 1 positions shown; findings below may reference images not displayed]

FINDINGS: Streaky basilar atelectatic changes with central vascular crowding.
No consolidation, features of edema, pneumothorax, or effusion.
Pulmonary vascularity is normally distributed. The cardiomediastinal
contours are unremarkable for portable technique. No acute osseous
or soft tissue abnormality.
IMPRESSION: Atelectasis, otherwise no acute cardiopulmonary abnormality

## 2020-03-09 ENCOUNTER — Ambulatory Visit: Payer: Self-pay | Attending: Internal Medicine

## 2020-03-09 DIAGNOSIS — Z23 Encounter for immunization: Secondary | ICD-10-CM

## 2020-03-09 NOTE — Progress Notes (Signed)
C\   Covid-19 Vaccination Clinic  Name:  Christopher Garrett    MRN: [id number] DOB: [birth year]  03/09/2020  [name] was observed post Covid-19 immunization for 15 minutes without incident. He was provided with Vaccine Information Sheet and instruction to access the V-Safe system.   [name] was instructed to call 911 with any severe reactions post vaccine: Marland Kitchen Difficulty breathing  . Swelling of face and throat  . A fast heartbeat  . A bad rash all over body  . Dizziness and weakness   Immunizations Administered    Name Date Dose VIS Date Route   Pfizer COVID-19 Vaccine 03/09/2020  3:32 PM 0.3 mL 11/25/2019 Intramuscular   Manufacturer: [employer], [employer]   Lot: [id number]   NDC: [address]-8

## 2020-03-30 ENCOUNTER — Ambulatory Visit: Payer: Self-pay | Attending: Internal Medicine

## 2020-03-30 DIAGNOSIS — Z23 Encounter for immunization: Secondary | ICD-10-CM

## 2020-03-30 NOTE — Progress Notes (Signed)
   Covid-19 Vaccination Clinic  Name:  Christopher Garrett    MRN: [id number] DOB: 1986-09-28  03/30/2020  [name] was observed post Covid-19 immunization for 15 minutes without incident. He was provided with Vaccine Information Sheet and instruction to access the V-Safe system.   [name] was instructed to call 911 with any severe reactions post vaccine: Marland Kitchen Difficulty breathing  . Swelling of face and throat  . A fast heartbeat  . A bad rash all over body  . Dizziness and weakness   Immunizations Administered    Name Date Dose VIS Date Route   Pfizer COVID-19 Vaccine 03/30/2020  4:15 PM 0.3 mL 11/25/2019 Intramuscular   Manufacturer: [employer], [employer]   Lot: [id number]   NDC: [address]-7

## 2020-08-08 ENCOUNTER — Encounter (HOSPITAL_COMMUNITY): Payer: Self-pay | Admitting: Emergency Medicine

## 2020-08-08 ENCOUNTER — Emergency Department (HOSPITAL_COMMUNITY): Payer: Self-pay

## 2020-08-08 ENCOUNTER — Other Ambulatory Visit: Payer: Self-pay

## 2020-08-08 ENCOUNTER — Emergency Department (HOSPITAL_COMMUNITY)
Admission: EM | Admit: 2020-08-08 | Discharge: 2020-08-09 | Disposition: A | Discharge status: Home / Self Care | Payer: Self-pay | Attending: Emergency Medicine | Admitting: Emergency Medicine

## 2020-08-08 DIAGNOSIS — Z20822 Contact with and (suspected) exposure to covid-19: Secondary | ICD-10-CM | POA: Insufficient documentation

## 2020-08-08 DIAGNOSIS — Z5321 Procedure and treatment not carried out due to patient leaving prior to being seen by health care provider: Secondary | ICD-10-CM | POA: Insufficient documentation

## 2020-08-08 LAB — URINALYSIS, ROUTINE W REFLEX MICROSCOPIC
Bacteria, UA: NONE SEEN
Bilirubin Urine: NEGATIVE
Glucose, UA: >=500 mg/dL — AB
Hgb urine dipstick: NEGATIVE
Ketones, ur: NEGATIVE mg/dL
Leukocytes,Ua: NEGATIVE
Nitrite: NEGATIVE
Protein, ur: 100 mg/dL — AB
Specific Gravity, Urine: 1.014 (ref 1.005–1.030)
WBC, UA: >50 WBC/hpf — ABNORMAL HIGH (ref 0–5)
pH: 6 (ref 5.0–8.0)

## 2020-08-08 LAB — RAPID URINE DRUG SCREEN, HOSP PERFORMED
Amphetamines: NOT DETECTED
Barbiturates: NOT DETECTED
Benzodiazepines: NOT DETECTED
Cocaine: POSITIVE — AB
Opiates: NOT DETECTED
Tetrahydrocannabinol: NOT DETECTED

## 2020-08-08 NOTE — ED Triage Notes (Addendum)
Patient brought in by [facility] for unconscious/unresponsive. Patient is alert at this time. EMS gave 4 mg's of narcan on scene Patient is currently on 6 liters of oxygen by nasal cannula due to oxygen sats being in the 80's. Patient is Spanish speaking only. Interpreter in room.    

## 2020-08-09 LAB — SARS CORONAVIRUS 2 BY RT PCR (HOSPITAL ORDER, PERFORMED IN ~~LOC~~ HOSPITAL LAB): SARS Coronavirus 2: NEGATIVE
